# Patient Record
Sex: Male | Born: 1965 | Race: Black or African American | Hispanic: No | Marital: Single | Smoking: Current every day smoker
Health system: Southern US, Community
[De-identification: ages and names within clinical notes are randomized; demographics above are authoritative.]

## PROBLEM LIST (undated history)

## (undated) DIAGNOSIS — I10 Essential (primary) hypertension: Secondary | ICD-10-CM

## (undated) DIAGNOSIS — M1990 Unspecified osteoarthritis, unspecified site: Secondary | ICD-10-CM

## (undated) DIAGNOSIS — E785 Hyperlipidemia, unspecified: Secondary | ICD-10-CM

## (undated) DIAGNOSIS — E119 Type 2 diabetes mellitus without complications: Secondary | ICD-10-CM

## (undated) DIAGNOSIS — R7989 Other specified abnormal findings of blood chemistry: Secondary | ICD-10-CM

## (undated) HISTORY — DX: Type 2 diabetes mellitus without complications: E11.9

## (undated) HISTORY — DX: Essential (primary) hypertension: I10

## (undated) HISTORY — DX: Unspecified osteoarthritis, unspecified site: M19.90

## (undated) HISTORY — PX: HERNIA REPAIR: SHX51

## (undated) HISTORY — DX: Other specified abnormal findings of blood chemistry: R79.89

## (undated) HISTORY — DX: Hyperlipidemia, unspecified: E78.5

## (undated) HISTORY — PX: FINGER SURGERY: SHX640

---

## 1985-03-24 HISTORY — PX: WISDOM TOOTH EXTRACTION: SHX21

## 1988-03-24 HISTORY — PX: INGUINAL HERNIA REPAIR: SUR1180

## 1989-03-24 HISTORY — PX: FINGER SURGERY: SHX640

## 1998-03-24 HISTORY — PX: COLONOSCOPY: SHX174

## 2000-12-12 ENCOUNTER — Emergency Department (HOSPITAL_COMMUNITY): Admission: EM | Admit: 2000-12-12 | Discharge: 2000-12-12 | Payer: Self-pay | Admitting: Emergency Medicine

## 2000-12-12 ENCOUNTER — Encounter: Payer: Self-pay | Admitting: *Deleted

## 2003-06-12 ENCOUNTER — Emergency Department (HOSPITAL_COMMUNITY): Admission: AD | Admit: 2003-06-12 | Discharge: 2003-06-12 | Payer: Self-pay | Admitting: Family Medicine

## 2003-06-16 ENCOUNTER — Emergency Department (HOSPITAL_COMMUNITY): Admission: AD | Admit: 2003-06-16 | Discharge: 2003-06-16 | Payer: Self-pay | Admitting: Family Medicine

## 2008-03-21 ENCOUNTER — Emergency Department (HOSPITAL_COMMUNITY): Admission: EM | Admit: 2008-03-21 | Discharge: 2008-03-21 | Payer: Self-pay | Admitting: Family Medicine

## 2010-12-27 LAB — GC/CHLAMYDIA PROBE AMP, GENITAL
Chlamydia, DNA Probe: NEGATIVE
GC Probe Amp, Genital: NEGATIVE

## 2013-03-24 ENCOUNTER — Other Ambulatory Visit: Payer: Self-pay | Admitting: Family Medicine

## 2013-11-11 ENCOUNTER — Ambulatory Visit (INDEPENDENT_AMBULATORY_CARE_PROVIDER_SITE_OTHER): Payer: Self-pay | Admitting: Family Medicine

## 2013-11-11 VITALS — BP 118/72 | HR 85 | Temp 97.9°F | Resp 18 | Ht 65.5 in | Wt 192.0 lb

## 2013-11-11 DIAGNOSIS — E1165 Type 2 diabetes mellitus with hyperglycemia: Secondary | ICD-10-CM

## 2013-11-11 DIAGNOSIS — Z0289 Encounter for other administrative examinations: Secondary | ICD-10-CM

## 2013-11-11 DIAGNOSIS — IMO0001 Reserved for inherently not codable concepts without codable children: Secondary | ICD-10-CM

## 2013-11-11 NOTE — Progress Notes (Signed)
Physical exam:  History: This is a 48 year old man who comes in for a DOT physical exam. Apparently his car and was taken from him when he was laid off for a month because of severely uncontrolled diabetes. His blood glucose was high and the hemoglobin A1c was above 12. He has worked hard with a lot of regular exercise and medications as been changed. He is tolerating his new medications well including Xigduo XR 07/998 twice a day, glipizide 10 twice a day, and pioglitazone 30 mg daily. His A1c today at his doctor's office was 9.3, and glucose down to 133. He's been running 120-150 at home fasting. He feels much better. He has not any syncopal episodes.   Medical history: Operations: Inguinal herniorrhaphy Medical illnesses: Type 2 diabetes Medications: As above Allergies: None  Social history: Lives in [place]. 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  Review of systems: Constitutional: Energy level better Cardiovascular: Unremarkable Respiratory: Unremarkable HEENT: Unremarkable GI: Unremarkable GU: Unremarkable The skeletal: Unremarkable Dermatologic: Unremarkable Neurologic: Unremarkable   Physical exam: Healthy-appearing man in no acute distress. TMs normal. Eyes PERRLA. Fundi red reflex well seen, did not get a good retinal exam. Throat was clear. Neck supple without nodes thyromegaly. No carotid bruits. Chest clear. Heart regular without murmurs. Abdomen soft without mass or tenderness. Normal external genitalia with testes descended. Mild weakness of the right inguinal ring. Extremities unremarkable. Skin unremarkable. Skin spine normal.  Assessment: DOT physical examination Type 2 diabetes mellitus, poorly controlled, much improved  Plan: 6 months card. After the turn of the year he needs to come back in here and proved that he is maintaining good control still. Cautioned him that when he gets back on the road he will not be getting as much exercise and needs to be  asked her careful about his eating so that his sugars do not go back up.

## 2017-05-14 ENCOUNTER — Other Ambulatory Visit: Payer: Self-pay | Admitting: Specialist

## 2017-05-14 DIAGNOSIS — N63 Unspecified lump in unspecified breast: Secondary | ICD-10-CM

## 2017-07-09 ENCOUNTER — Other Ambulatory Visit: Payer: Self-pay | Admitting: Specialist

## 2017-07-09 DIAGNOSIS — N63 Unspecified lump in unspecified breast: Secondary | ICD-10-CM

## 2017-08-18 ENCOUNTER — Encounter: Payer: Self-pay | Admitting: Gastroenterology

## 2017-11-10 ENCOUNTER — Encounter: Payer: Self-pay | Admitting: Gastroenterology

## 2017-11-17 ENCOUNTER — Ambulatory Visit: Payer: Self-pay | Admitting: Gastroenterology

## 2017-12-28 ENCOUNTER — Ambulatory Visit: Payer: Self-pay | Admitting: Gastroenterology

## 2018-02-16 ENCOUNTER — Ambulatory Visit: Payer: Self-pay | Admitting: Gastroenterology

## 2019-11-24 ENCOUNTER — Other Ambulatory Visit: Payer: Self-pay

## 2019-11-24 ENCOUNTER — Ambulatory Visit: Payer: Managed Care, Other (non HMO) | Admitting: Student

## 2019-11-24 ENCOUNTER — Encounter: Payer: Self-pay | Admitting: Student

## 2019-11-24 VITALS — BP 123/79 | HR 107 | Temp 98.1°F | Wt 200.1 lb

## 2019-11-24 DIAGNOSIS — E119 Type 2 diabetes mellitus without complications: Secondary | ICD-10-CM

## 2019-11-24 DIAGNOSIS — Z1211 Encounter for screening for malignant neoplasm of colon: Secondary | ICD-10-CM

## 2019-11-24 DIAGNOSIS — A539 Syphilis, unspecified: Secondary | ICD-10-CM | POA: Insufficient documentation

## 2019-11-24 LAB — GLUCOSE, CAPILLARY: Glucose-Capillary: 223 mg/dL — ABNORMAL HIGH (ref 70–99)

## 2019-11-24 LAB — POCT GLYCOSYLATED HEMOGLOBIN (HGB A1C): Hemoglobin A1C: 7.2 % — AB (ref 4.0–5.6)

## 2019-11-24 MED ORDER — LISINOPRIL 2.5 MG PO TABS: 2.5000 mg | ORAL_TABLET | Freq: Every day | ORAL | 6 refills | Status: DC

## 2019-11-24 MED ORDER — XIGDUO XR 5-1000 MG PO TB24: 5.0000 mg | ORAL_TABLET | Freq: Every day | ORAL | 6 refills | Status: DC

## 2019-11-24 NOTE — Assessment & Plan Note (Signed)
Patient states that he was treated for possible syphilis in the past due to a painful lesion on right testicle.   Plan: -Check RPR, HIV, and hepatitis C panel

## 2019-11-24 NOTE — Patient Instructions (Addendum)
Mr. Baumgardner,  It was a pleasure getting to know you today.  Here is a summary what we talked about  1.  Type 2 diabetes: Your A1c today is 7.2.  You are doing a great job at diabetic management.  Continue taking the current medication.  Continue to eat healthy diet as much as possible and exercise as tolerated.  I ordered some blood test today and I will call you when I have the results.  2.  Unknown syphilis: I order a screening test for syphilis and I will call you when I have the results.  Please contact us if you have any questions or concerns,  TakeCare,  Gaylan Gerold, DO

## 2019-11-24 NOTE — Progress Notes (Addendum)
New Patient Office Visit  Subjective:  Patient ID: Aaron Guzman, male    DOB: 1965/08/03  Age: 54 y.o. MRN: [id number]  CC:  Chief Complaint  Patient presents with   Diabetes   Groin Pain    HPI Aaron Guzman is a 54 year old male with past medical history of type 2 diabetes who is here for establish care.  Patient was seen by PCP on [address] since 2014.  His last visit was around September 2020 and he states that they did blood work at that visit.  He said his last A1c was 7.8.  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  Patient reportedly taking dapagliflozin/Metformin and lisinopril.  Please see assessment and plan tab for further detail.  Past Medical History:  Diagnosis Date   Diabetes mellitus without complication ([place])    Hyperlipidemia    Hypertension    Low testosterone     Past Surgical History:  Procedure Laterality Date   FINGER SURGERY     HERNIA REPAIR      Family History  Problem Relation Age of Onset   Diabetes Mother    Cancer Mother    Diabetes Father    Cancer Father     Social History   Socioeconomic History   Marital status: Single    Spouse name: Not on file   Number of children: Not on file   Years of education: Not on file   Highest education level: Not on file  Occupational History   Not on file  Tobacco Use   Smoking status: Current Every Day Smoker    Types: Cigars   Tobacco comment: 1 pack a month  Substance and Sexual Activity   Alcohol use: Yes    Comment: Occasional   Drug use: No   Sexual activity: Not on file  Other Topics Concern   Not on file  Social History Narrative   Not on file   Social Determinants of Health   Financial Resource Strain:    Difficulty of Paying Living Expenses: Not on file  Food Insecurity:    Worried About Running Out of Food in the Last Year: Not on file   Ran Out of Food in the Last Year: Not on file  Transportation  Needs:    Lack of Transportation (Medical): Not on file   Lack of Transportation (Non-Medical): Not on file  Physical Activity:    Days of Exercise per Week: Not on file   Minutes of Exercise per Session: Not on file  Stress:    Feeling of Stress : Not on file  Social Connections:    Frequency of Communication with Friends and Family: Not on file   Frequency of Social Gatherings with Friends and Family: Not on file   Attends Religious Services: Not on file   Active Member of Clubs or Organizations: Not on file   Attends Archivist Meetings: Not on file   Marital Status: Not on file  Intimate Partner Violence:    Fear of Current or Ex-Partner: Not on file   Emotionally Abused: Not on file   Physically Abused: Not on file   Sexually Abused: Not on file    ROS   As per HPI   Objective:   Today's Vitals: BP 123/79 (BP Location: Right Arm, Patient Position: Sitting, Cuff Size: Small)    Pulse (!) 107    Temp 98.1 F (36.7 C) (Oral)    Wt 200 lb  1.6 oz (90.8 kg)    SpO2 97%    BMI 32.79 kg/m   Physical Exam Constitutional:      General: He is not in acute distress. HENT:     Head: Normocephalic.  Eyes:     General: No scleral icterus. Cardiovascular:     Rate and Rhythm: Normal rate and regular rhythm.     Heart sounds: No murmur heard.   Pulmonary:     Effort: No respiratory distress.     Breath sounds: Normal breath sounds.  Abdominal:     General: Bowel sounds are normal.     Palpations: Abdomen is soft.  Musculoskeletal:     Cervical back: Normal range of motion.     Right lower leg: No edema.     Left lower leg: No edema.     Comments: No wound noted +2 pulses LE bilat  Skin:    General: Skin is warm.     Coloration: Skin is not jaundiced.  Neurological:     Mental Status: He is alert.  Psychiatric:        Mood and Affect: Mood normal.      Assessment & Plan:   Problem List Items Addressed This Visit      Endocrine   Type  2 diabetes mellitus ([place]) - Primary    Today A1c 7.2 and random blood sugar was 223.  Patient is currently taking dapagliflozin/Metformin and has good diabetic control.  He is also taking lisinopril after found to have protein in the urine.  Patient states that he needs an eye exam referral.  He does check his feet at home and denies wound, ulcer, or numbness/tingling sensation.  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  He only checks CBG when he feels unwell.  Plan: -Hemoglobin A1c in 3 months -Continue dapagliflozin/Metformin 07/998 -Continue taking lisinopril 2.5 mg -Encourage healthier diet -Encourage exercise as able -Check BMP, UA with microalbumin and lipid panel today      Relevant Orders   POC Hbg A1C (Completed)   Lipid Profile   BMP8+Anion Gap   Microalbumin / Creatinine Urine Ratio     Other   Syphilis    Patient states that he was treated for possible syphilis in the past due to a painful lesion on right testicle.   Plan: -Check RPR, HIV, and hepatitis C panel      Relevant Orders   HIV antibody (with reflex)   Hepatitis C antibody   RPR      Outpatient Encounter Medications as of 11/24/2019  Medication Sig   Dapagliflozin-Metformin HCl ER (XIGDUO XR) 07-998 MG TB24 Take by mouth.   glipiZIDE (GLUCOTROL) 10 MG tablet Take 10 mg by mouth 2 (two) times daily before a meal.   pioglitazone (ACTOS) 30 MG tablet Take 30 mg by mouth daily.   No facility-administered encounter medications on file as of 11/24/2019.    Follow-up: Return in about 3 months (around 02/23/2020).   Gaylan Gerold, DO   Assessment & Plan:   See Encounters Tab for problem based charting.  Patient seen with Dr. Evette Doffing

## 2019-11-24 NOTE — Assessment & Plan Note (Addendum)
Today A1c 7.2 and random blood sugar was 223.  Patient is currently taking dapagliflozin/Metformin and has good diabetic control.  He is also taking lisinopril after found to have protein in the urine.  Patient states that he needs an eye exam referral.  He does check his feet at home and denies wound, ulcer, or numbness/tingling sensation.  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  He only checks CBG when he feels unwell.  Plan: -Hemoglobin A1c in 3 months -Continue dapagliflozin/Metformin 07/998 -Continue taking lisinopril 2.5 mg -Encourage healthier diet -Encourage exercise as able -Check BMP, UA with microalbumin and lipid panel today

## 2019-11-25 ENCOUNTER — Other Ambulatory Visit: Payer: Self-pay | Admitting: Student

## 2019-11-25 DIAGNOSIS — E782 Mixed hyperlipidemia: Secondary | ICD-10-CM

## 2019-11-25 LAB — BMP8+ANION GAP
Anion Gap: 19 mmol/L — ABNORMAL HIGH (ref 10.0–18.0)
BUN/Creatinine Ratio: 11 (ref 9–20)
BUN: 12 mg/dL (ref 6–24)
CO2: 17 mmol/L — ABNORMAL LOW (ref 20–29)
Calcium: 9.7 mg/dL (ref 8.7–10.2)
Chloride: 104 mmol/L (ref 96–106)
Creatinine, Ser: 1.08 mg/dL (ref 0.76–1.27)
GFR calc Af Amer: 89 mL/min/{1.73_m2} (ref >59)
GFR calc non Af Amer: 77 mL/min/{1.73_m2} (ref >59)
Glucose: 167 mg/dL — ABNORMAL HIGH (ref 65–99)
Potassium: 4.2 mmol/L (ref 3.5–5.2)
Sodium: 140 mmol/L (ref 134–144)

## 2019-11-25 LAB — LIPID PANEL
Chol/HDL Ratio: 4.5 ratio (ref 0.0–5.0)
Cholesterol, Total: 191 mg/dL (ref 100–199)
HDL: 42 mg/dL (ref >39)
LDL Chol Calc (NIH): 106 mg/dL — ABNORMAL HIGH (ref 0–99)
Triglycerides: 248 mg/dL — ABNORMAL HIGH (ref 0–149)
VLDL Cholesterol Cal: 43 mg/dL — ABNORMAL HIGH (ref 5–40)

## 2019-11-25 LAB — RPR: RPR Ser Ql: NONREACTIVE

## 2019-11-25 LAB — HEPATITIS C ANTIBODY: Hep C Virus Ab: <0.1 s/co ratio (ref 0.0–0.9)

## 2019-11-25 LAB — MICROALBUMIN / CREATININE URINE RATIO
Creatinine, Urine: 55.4 mg/dL
Microalb/Creat Ratio: <5 mg/g creat (ref 0–29)
Microalbumin, Urine: <3 ug/mL

## 2019-11-25 LAB — HIV ANTIBODY (ROUTINE TESTING W REFLEX): HIV Screen 4th Generation wRfx: NONREACTIVE

## 2019-11-25 MED ORDER — ATORVASTATIN CALCIUM 80 MG PO TABS: 80.0000 mg | ORAL_TABLET | Freq: Every day | ORAL | 11 refills | Status: DC

## 2019-11-25 NOTE — Progress Notes (Signed)
Internal Medicine Clinic Attending  I saw and evaluated the patient.  I personally confirmed the key portions of the history and exam documented by Dr. Alfonse Spruce and I reviewed pertinent patient test results.  The assessment, diagnosis, and plan were formulated together and I agree with the documentation in the resident's note.   54 year old person living with diabetes. Glucose control is reasonable, he is feeling well with good functional status. Labs show an incidental anion gap metabolic acidosis. I am not sure the cause, or if it is accurate, as he shows no signs or symptoms of acidosis. No signs of DKA and doubt lactic acidosis given his appearance. Will plan to repeat labs at next visit, if the gap persists, would stop metformin and do more work up for the cause.

## 2019-12-09 ENCOUNTER — Encounter: Payer: Self-pay | Admitting: Gastroenterology

## 2019-12-23 ENCOUNTER — Telehealth: Payer: Self-pay | Admitting: Dietician

## 2019-12-23 NOTE — Telephone Encounter (Signed)
Mr. Aaron Guzman called asking about a Continuous glucose monitor to help he and his doctor see how his blood sugars are doing. He does not like sticking his finger and is interested in trying a sample. He agreed to call when he is back in town.

## 2020-02-22 ENCOUNTER — Encounter: Payer: Managed Care, Other (non HMO) | Admitting: Gastroenterology

## 2020-02-23 ENCOUNTER — Ambulatory Visit (INDEPENDENT_AMBULATORY_CARE_PROVIDER_SITE_OTHER): Payer: No Typology Code available for payment source | Admitting: Student

## 2020-02-23 ENCOUNTER — Other Ambulatory Visit: Payer: Self-pay

## 2020-02-23 ENCOUNTER — Encounter: Payer: Self-pay | Admitting: Student

## 2020-02-23 VITALS — BP 125/71 | HR 99 | Temp 98.2°F | Ht 66.0 in | Wt 207.1 lb

## 2020-02-23 DIAGNOSIS — E785 Hyperlipidemia, unspecified: Secondary | ICD-10-CM | POA: Insufficient documentation

## 2020-02-23 DIAGNOSIS — M19012 Primary osteoarthritis, left shoulder: Secondary | ICD-10-CM | POA: Diagnosis not present

## 2020-02-23 DIAGNOSIS — M151 Heberden's nodes (with arthropathy): Secondary | ICD-10-CM | POA: Diagnosis not present

## 2020-02-23 DIAGNOSIS — E119 Type 2 diabetes mellitus without complications: Secondary | ICD-10-CM

## 2020-02-23 DIAGNOSIS — G8929 Other chronic pain: Secondary | ICD-10-CM

## 2020-02-23 DIAGNOSIS — M25512 Pain in left shoulder: Secondary | ICD-10-CM | POA: Diagnosis not present

## 2020-02-23 DIAGNOSIS — E782 Mixed hyperlipidemia: Secondary | ICD-10-CM

## 2020-02-23 LAB — POCT GLYCOSYLATED HEMOGLOBIN (HGB A1C): Hemoglobin A1C: 8 % — AB (ref 4.0–5.6)

## 2020-02-23 LAB — GLUCOSE, CAPILLARY: Glucose-Capillary: 179 mg/dL — ABNORMAL HIGH (ref 70–99)

## 2020-02-23 MED ORDER — XIGDUO XR 5-1000 MG PO TB24: 2.0000 | ORAL_TABLET | Freq: Every day | ORAL | 2 refills | Status: DC

## 2020-02-23 NOTE — Assessment & Plan Note (Signed)
Patient complains of left shoulder pain that has been going on for about 3 months.  Pain locates in the left upper shoulder and shoulder joint.  Described pain as throbbing and aching, worse with motion and better with rest.  States that sometimes his left arm feel weak.  Denies trauma to the area or fever.  Patient had been lifting weights but had to stop due to pain.  Also contributed this pain to sleeping on his right side in the truck.  He has not taking any medication to help with the pain.  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  Advised patient to take NSAIDs for pain relief.  Also can try Voltaren gel.  Patient is advised to let the shoulder rest for a few weeks before starting back on weightlifting.  If conservative treatments are ineffective, can consider steroid joint injection.

## 2020-02-23 NOTE — Patient Instructions (Addendum)
Aaron Guzman,  It is a pleasure seeing you today.  Here is a summary of what we talked about:  1.  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  You can take NSAIDs such as Motrin for pain relief.  You can also obtain Voltaren gel over-the-counter and apply it on your shoulder.  Please rest your shoulder for few weeks.  2.  Right finger joint pain: I will order x-ray of your right hand to check for any bone abnormality.  I also obtain some blood work to rule out other causes of his joint pain  3.  Diabetes: I will check hemoglobin A1c today.  I also refer you to our diabetic coordinator who can help you with ordering the freestyle libre continuous glucose monitor.  Take care  Dr. Alfonse Spruce

## 2020-02-23 NOTE — Progress Notes (Signed)
   CC: Left shoulder pain and right DIP joints pain  HPI:  [name] is a 54 y.o. of diabetes, hypertension, hyperlipidemia who presents to the clinic for chief complaint of left shoulder pain and right DIP joints pain  Please see problem based charting for further detail  Past Medical History:  Diagnosis Date  . Diabetes mellitus without complication ([place])   . Hyperlipidemia   . Hypertension   . Low testosterone    Review of Systems: As per HPI  Physical Exam:  Vitals:   02/23/20 1330  BP: 125/71  Pulse: 99  Temp: 98.2 F (36.8 C)  TempSrc: Oral  SpO2: 96%  Weight: 207 lb 1.6 oz (93.9 kg)  Height: 5\' 6"  (1.676 m)   Physical Exam Constitutional:      General: He is not in acute distress. Eyes:     General:        Right eye: No discharge.        Left eye: No discharge.  Cardiovascular:     Rate and Rhythm: Normal rate and regular rhythm.     Heart sounds: Normal heart sounds.  Pulmonary:     Effort: No respiratory distress.     Breath sounds: Normal breath sounds.  Musculoskeletal:     Comments: Normal range of motion of left shoulder, pain of the acromioclavicular joint with palpation.  Normal strength and sensation.  Normal range of motion of bilateral DIP joints.  Some hardened, bony structure palpated, especially in the right middle finger DIP joint  Neurological:     Mental Status: He is alert.  Psychiatric:        Mood and Affect: Mood normal.      Assessment & Plan:   See Encounters Tab for problem based charting.  Patient seen with Dr. Rebeca Alert

## 2020-02-23 NOTE — Assessment & Plan Note (Signed)
He complains of pain bilateral DIP joints that has been going for years.  Pain is worst of the right middle finger.  States that the DIP joint is swollen and painful to touch when it occurs.  Patient states that he has been jamming his fingers onto hard surface multiple times and it/abates his pain.  Physical exam shows normal range of motion of the DIP joints.  However cannot palpate some hardened structure beneath the joint which suspicion for tophi vs bony injuries.  Plan: -Obtain x-ray of his right hand to rule out any bony abnormality. -Cannot rule out inflammatory joint disease so obtain ESR and CRP -Also obtain uric acid for gout suspicion

## 2020-02-23 NOTE — Assessment & Plan Note (Signed)
Last A1c was 7.2 in September and today 8.0.  Patient states that he has been taking his dapagliflozin-Metformin 07-998 mg once a day instead of 2 tablets daily as he was prescribed by his last PCP.  Advised patient to start taking this medication twice daily.  New prescription sent in.  Patient also requests continuous glucose monitor advised.  Plan: -Dapagliflozin-Metformin 07-998 mg 2 tablets daily -A1c in 3 months -Also referral to [facility] for freestyle Ver[place] request

## 2020-02-24 LAB — URIC ACID: Uric Acid: 5.5 mg/dL (ref 3.8–8.4)

## 2020-02-24 LAB — C-REACTIVE PROTEIN: CRP: 3 mg/L (ref 0–10)

## 2020-02-24 LAB — SEDIMENTATION RATE: Sed Rate: 8 mm/hr (ref 0–30)

## 2020-02-24 LAB — HM DIABETES EYE EXAM

## 2020-02-27 ENCOUNTER — Other Ambulatory Visit: Payer: Self-pay | Admitting: Student

## 2020-02-27 DIAGNOSIS — E119 Type 2 diabetes mellitus without complications: Secondary | ICD-10-CM

## 2020-02-27 NOTE — Progress Notes (Signed)
Last BMP in September show metabolic acidosis.  Will repeat BMP when patient comes back to [facility] in January.

## 2020-03-01 ENCOUNTER — Encounter: Payer: Self-pay | Admitting: *Deleted

## 2020-03-06 NOTE — Progress Notes (Signed)
Internal Medicine Clinic Attending  I saw and evaluated the patient.  I personally confirmed the key portions of the history and exam documented by Dr. Alfonse Spruce and I reviewed pertinent patient test results.  The assessment, diagnosis, and plan were formulated together and I agree with the documentation in the resident's note.  Lenice Pressman, M.D., Ph.D.

## 2020-03-27 NOTE — Addendum Note (Signed)
Addended by: Neomia Dear on: 03/27/2020 05:43 PM   Modules accepted: Orders

## 2020-04-16 ENCOUNTER — Encounter: Payer: No Typology Code available for payment source | Admitting: Dietician

## 2020-04-16 ENCOUNTER — Other Ambulatory Visit: Payer: Self-pay | Admitting: Dietician

## 2020-04-16 DIAGNOSIS — E119 Type 2 diabetes mellitus without complications: Secondary | ICD-10-CM

## 2020-04-16 MED ORDER — FREESTYLE LIBRE 2 SENSOR MISC: 1 refills | Status: DC

## 2020-04-16 NOTE — Telephone Encounter (Addendum)
Aaron Guzman rescheduled his appointment for a trial CGM. I asked him to try to download the [facility] 2 app and to call if he cannot. Sent him and his pharmacy the  voucher for a free sample sensor below. Request prescription for a Freestyle Libre 2 sensor be sent to [place]. BIN: [id number] AYOKH:[id number] MEMBER: [id number] Expiration:06/15/20 [id number]

## 2020-05-04 ENCOUNTER — Ambulatory Visit (INDEPENDENT_AMBULATORY_CARE_PROVIDER_SITE_OTHER): Payer: No Typology Code available for payment source | Admitting: Dietician

## 2020-05-04 ENCOUNTER — Encounter: Payer: Self-pay | Admitting: Dietician

## 2020-05-04 DIAGNOSIS — Z713 Dietary counseling and surveillance: Secondary | ICD-10-CM | POA: Diagnosis not present

## 2020-05-04 DIAGNOSIS — E119 Type 2 diabetes mellitus without complications: Secondary | ICD-10-CM | POA: Diagnosis not present

## 2020-05-04 NOTE — Patient Instructions (Addendum)
Thank you for your visit today!  You started your first Freestyle [place] 2 sensor today.   You can use skin tac or an overpatch to help the sensor stick if needed.   We suggest scanning your sensor at least 6 times a day  1- Before meals 2- 1-2 hours After meals 3- when you do not feel right or feel funny 4- when you wake up 5-  Before bed  [facility] Penny [phone]

## 2020-05-04 NOTE — Progress Notes (Signed)
Diabetes Self-Management Education  Visit Type: First/Initial  Appt. Start Time: 1100 Appt. End Time: 1130  05/04/2020  Mr. Aaron Guzman, identified by name and date of birth, is a 55 y.o. male with a diagnosis of Diabetes: Type 2.   ASSESSMENT Freestyle Libre Personal CGM Training Total time: [place] was educated about the following:  -Getting to know device    Marland Kitchen/phone programmed ) -Setting up device , trend arrows - sensor interaction with vitamin C: do not take more than 500 mg vitamin C daily -Setting alert profile (high alert  240 , low alert 70)  setting up reminders (reminders for 6x/day testing  ) -Inserting sensor (patient applied the sensor on his right upper back of arm  himself today with minimal assist. It appeared to be working and in warm up when he left the office) -Calibrating- none required. - using meter when test blood sugar symbol appears -Ending sensor session -Trouble shooting -Tape guide, ability to upload from home with email address (he accepted invitation today)   Patient has [facility] tech support and my contact information. Follow up was arranged in 4 weeks    Diabetes Self-Management Education - 05/04/20 1200      Visit Information   Visit Type First/Initial      Initial Visit   Diabetes Type Type 2    Are you currently following a meal plan? No    Are you taking your medications as prescribed? Yes      Health Coping   How would you rate your overall health? Good      Psychosocial Assessment   Patient Belief/Attitude about Diabetes Motivated to manage diabetes    Self-care barriers Other (comment)   he is a long distance truck driver   Self-management support Family;Doctor's office;CDE visits    Other persons present Other (comment)   Ailene Ravel, RDN   Patient Concerns Monitoring    Special Needs None    Preferred Learning Style Hands on    Learning Readiness Ready    How often do you need to have someone help  you when you read instructions, pamphlets, or other written materials from your doctor or pharmacy? 2 - Rarely      Pre-Education Assessment   Patient understands the diabetes disease and treatment process. Demonstrates understanding / competency    Patient understands incorporating nutritional management into lifestyle. Needs Instruction    Patient undertands incorporating physical activity into lifestyle. Demonstrates understanding / competency    Patient understands using medications safely. Demonstrates understanding / competency    Patient understands monitoring blood glucose, interpreting and using results Needs Instruction    Patient understands prevention, detection, and treatment of acute complications. Needs Instruction    Patient understands prevention, detection, and treatment of chronic complications. Needs Instruction    Patient understands how to develop strategies to address psychosocial issues. Demonstrates understanding / competency    Patient understands how to develop strategies to promote health/change behavior. Demonstrates understanding / competency      Complications   Last HgB A1C per patient/outside source 8 %    How often do you check your blood sugar? 0 times/day (not testing)    Have you had a dilated eye exam in the past 12 months? Yes    Have you had a dental exam in the past 12 months? Yes      Dietary Intake   Beverage(s) ginger ale, mt dew and orange soda      Exercise  Exercise Type ADL's;Light (walking / raking leaves)      Patient Education   Previous Diabetes Education No    Nutrition management  Role of diet in the treatment of diabetes and the relationship between the three main macronutrients and blood glucose level;Reviewed blood glucose goals for pre and post meals and how to evaluate the patients' food intake on their blood glucose level.    Monitoring Other (comment)   educated about personal CGM   Acute complications Discussed and identified  patients' treatment of hyperglycemia.      Individualized Goals (developed by patient)   Monitoring  test my blood glucose as discussed      Outcomes   Expected Outcomes Demonstrated interest in learning. Expect positive outcomes    Future DMSE 4-6 wks    Program Status Not Completed           Individualized Plan for Diabetes Self-Management Training:   Learning Objective:  Patient will have a greater understanding of diabetes self-management. Patient education plan is to attend individual and/or group sessions per assessed needs and concerns.   Plan:   Patient Instructions  Thank you for your visit today!  You started your first Freestyle [place] 2 sensor today.   You can use skin tac or an overpatch to help the sensor stick if needed.   We suggest scanning your sensor at least 6 times a day  1- Before meals 2- 1-2 hours After meals 3- when you do not feel right or feel funny 4- when you wake up 5-  Before bed  [facility] Penny [phone]     Expected Outcomes:  Demonstrated interest in learning. Expect positive outcomes  Education material provided: Diabetes Resources  If problems or questions, patient to contact team via:  Phone  Future DSME appointment: 4-6 wks  Debera Lat, RD 05/04/2020 12:15 PM.

## 2020-05-05 LAB — BMP8+ANION GAP
Anion Gap: 20 mmol/L — ABNORMAL HIGH (ref 10.0–18.0)
BUN/Creatinine Ratio: 15 (ref 9–20)
BUN: 14 mg/dL (ref 6–24)
CO2: 15 mmol/L — ABNORMAL LOW (ref 20–29)
Calcium: 9.6 mg/dL (ref 8.7–10.2)
Chloride: 104 mmol/L (ref 96–106)
Creatinine, Ser: 0.96 mg/dL (ref 0.76–1.27)
GFR calc Af Amer: 102 mL/min/{1.73_m2} (ref >59)
GFR calc non Af Amer: 89 mL/min/{1.73_m2} (ref >59)
Glucose: 256 mg/dL — ABNORMAL HIGH (ref 65–99)
Potassium: 4 mmol/L (ref 3.5–5.2)
Sodium: 139 mmol/L (ref 134–144)

## 2020-05-16 ENCOUNTER — Other Ambulatory Visit: Payer: Self-pay | Admitting: Internal Medicine

## 2020-05-16 DIAGNOSIS — E119 Type 2 diabetes mellitus without complications: Secondary | ICD-10-CM

## 2020-05-17 ENCOUNTER — Other Ambulatory Visit: Payer: Self-pay

## 2020-05-17 DIAGNOSIS — E119 Type 2 diabetes mellitus without complications: Secondary | ICD-10-CM

## 2020-05-17 MED ORDER — XIGDUO XR 5-1000 MG PO TB24: 2.0000 | ORAL_TABLET | Freq: Every day | ORAL | 3 refills | Status: DC

## 2020-05-17 NOTE — Telephone Encounter (Signed)
He is requesting a 90 day supply of his Diabetic medication sent to pharmacy for him to have for next fill.  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  He would also like a 3 month RX for hix Freestyle Libre 2 sensor, but states he can get this at his NOV which is 06/01/20 with Dr. Alfonse Spruce (added to appt notes) Thank you, SChaplin, RN,BSN

## 2020-05-30 ENCOUNTER — Other Ambulatory Visit: Payer: Self-pay | Admitting: Student

## 2020-05-30 DIAGNOSIS — E119 Type 2 diabetes mellitus without complications: Secondary | ICD-10-CM

## 2020-06-01 ENCOUNTER — Ambulatory Visit (INDEPENDENT_AMBULATORY_CARE_PROVIDER_SITE_OTHER): Payer: No Typology Code available for payment source | Admitting: Student

## 2020-06-01 ENCOUNTER — Ambulatory Visit (HOSPITAL_COMMUNITY)
Admission: RE | Admit: 2020-06-01 | Discharge: 2020-06-01 | Disposition: A | Discharge status: Home / Self Care | Payer: No Typology Code available for payment source | Source: Ambulatory Visit | Attending: Internal Medicine | Admitting: Internal Medicine

## 2020-06-01 ENCOUNTER — Other Ambulatory Visit: Payer: Self-pay

## 2020-06-01 ENCOUNTER — Encounter: Payer: Self-pay | Admitting: Student

## 2020-06-01 VITALS — BP 128/79 | HR 90 | Temp 98.0°F | Wt 203.4 lb

## 2020-06-01 DIAGNOSIS — M151 Heberden's nodes (with arthropathy): Secondary | ICD-10-CM | POA: Diagnosis not present

## 2020-06-01 DIAGNOSIS — E782 Mixed hyperlipidemia: Secondary | ICD-10-CM

## 2020-06-01 DIAGNOSIS — E119 Type 2 diabetes mellitus without complications: Secondary | ICD-10-CM

## 2020-06-01 DIAGNOSIS — I10 Essential (primary) hypertension: Secondary | ICD-10-CM | POA: Diagnosis not present

## 2020-06-01 LAB — POCT GLYCOSYLATED HEMOGLOBIN (HGB A1C): Hemoglobin A1C: 7.7 % — AB (ref 4.0–5.6)

## 2020-06-01 LAB — GLUCOSE, CAPILLARY: Glucose-Capillary: 228 mg/dL — ABNORMAL HIGH (ref 70–99)

## 2020-06-01 LAB — LACTIC ACID, PLASMA: Lactic Acid, Venous: 4.1 mmol/L (ref 0.5–1.9)

## 2020-06-01 MED ORDER — DAPAGLIFLOZIN PROPANEDIOL 10 MG PO TABS: 10.0000 mg | ORAL_TABLET | Freq: Every day | ORAL | 3 refills | Status: DC

## 2020-06-01 MED ORDER — ROSUVASTATIN CALCIUM 20 MG PO TABS: 20.0000 mg | ORAL_TABLET | Freq: Every day | ORAL | 3 refills | Status: DC

## 2020-06-01 MED ORDER — TRULICITY 0.75 MG/0.5ML ~~LOC~~ SOAJ: 0.7500 mg | SUBCUTANEOUS | 3 refills | Status: DC

## 2020-06-01 NOTE — Assessment & Plan Note (Signed)
Initial blood pressure 138/79.  Repeat 128/79.  -Continue lisinopril 2.5 mg

## 2020-06-01 NOTE — Assessment & Plan Note (Signed)
Patient was on Lipitor 80 mg but stopped 1 month ago due to side effects of heartburn.  Patient states that his symptoms ceased after stopping Lipitor.  I offered to change to Crestor 20 mg.  Patient agrees.  -Stop Lipitor 80 mg -Start Crestor 20 mg

## 2020-06-01 NOTE — Progress Notes (Signed)
   CC: Diabetes follow-up  HPI:  Mr.Rodriquez Nicodemus is a 55 y.o. with past medical history of hypertension, diabetes, hyperlipidemia, who presented to the clinic for 3 months follow-up.  Please see problem space charting for further detail  Past Medical History:  Diagnosis Date  . Diabetes mellitus without complication ([place])   . Hyperlipidemia   . Hypertension   . Low testosterone    Review of Systems:   Review of Systems  Constitutional: Negative.   Gastrointestinal: Negative for constipation, diarrhea, nausea and vomiting.  Musculoskeletal: Positive for joint pain.    Physical Exam:  Vitals:   06/01/20 1026  BP: 138/79  Pulse: (!) 103  Temp: 98 F (36.7 C)  TempSrc: Oral  SpO2: 93%  Weight: 203 lb 6.4 oz (92.3 kg)   Physical Exam Constitutional:      General: He is not in acute distress. HENT:     Head: Normocephalic.  Eyes:     General:        Right eye: No discharge.        Left eye: No discharge.  Cardiovascular:     Rate and Rhythm: Normal rate and regular rhythm.  Pulmonary:     Effort: Pulmonary effort is normal. No respiratory distress.  Abdominal:     General: Bowel sounds are normal.  Musculoskeletal:     Cervical back: Normal range of motion.     Comments: Pain to palpation of DIP of right little and middle finger.  No joint effusion or erythema noted.  Protuberance of DIP joint palpated of right little middle finger.  Radial pulse palpated.  Normal range of motion of other fingers.  Skin:    General: Skin is warm.  Neurological:     General: No focal deficit present.     Mental Status: He is alert.  Psychiatric:        Mood and Affect: Mood normal.     Assessment & Plan:   See Encounters Tab for problem based charting.  Patient discussed with Dr. Dareen Piano

## 2020-06-01 NOTE — Patient Instructions (Addendum)
Mr. Aaron Guzman,  It is a pleasure seeing you in the clinic today.  Here is a summary of what we talked about:  1.  Diabetes: Your hemoglobin A1c is 7.7 today.  Goal hemoglobin A1C less than 7.  I will add a once weekly injection called Trulicity.  Please pick it up at your pharmacy.  I will call you for the results of your blood work and decide if we will continue Metformin.  2.  Cholesterol: I will change Lipitor to Crestor 20 mg daily.  Sent the prescription to your pharmacy.  3.  Hand pain: Please get the x-ray of stairs.  I will call you with the result.  Please use ibuprofen or Tylenol for pain and ice for swelling.  4.  High blood pressure: Your blood pressure looks good today.  Continue lisinopril 2.5 mg  Take care  Dr. Alfonse Spruce

## 2020-06-01 NOTE — Addendum Note (Signed)
Addended byGaylan Gerold on: 06/01/2020 12:08 PM   Modules accepted: Orders

## 2020-06-01 NOTE — Addendum Note (Signed)
Addended byGaylan Gerold on: 06/01/2020 12:28 PM   Modules accepted: Orders

## 2020-06-01 NOTE — Assessment & Plan Note (Signed)
Patient endorses pain of right little and middle finger DIP joints that started a few days ago but has improved.  Endorses some swelling of the joints as well.  He has had this pain for 2-1/2 years.  Denies any trauma to the area.  His CRP, ESR and uric acid checked last visit was within normal limits.  We also order x-ray of his right hand but was not done.  This is likely osteoarthritis due to his history.  His physical exam is benign.  Normal ESR, CRP and uric acid makes gout or rheumatoid arthritis less likely.  -Patient will obtain x-ray today -Advised patient to NSAIDs or Tylenol for pain.  Ice for inflammation and swelling

## 2020-06-01 NOTE — Assessment & Plan Note (Addendum)
A1c 3 months ago was 8, 7.7 today.  Patient currently taking dapagliflozin-Metformin 07-998 mg twice daily.  His last 2 BMP were consistent with anion gap metabolic acidosis.  Not sure if this related to lactic acid caused by Metformin.  Recheck BMP and lactic acid today, if normal will discontinue Metformin.  Also start Trulicity 1.31 mg weekly.  -Continue dapagliflozin-Metformin for now.  -Start Trulicity 4.38 mg weekly -A1c in 3 months -Pending BMP and lactic acid  Addendum Lactic acid came back 4.1.  This confirmed lactic acid caused by Metformin.  Will stop Metformin and continue dapagliflozin alone.  Repeat BMP in 1-2 weeks.  Patient was informed of the result and agree with the plan.

## 2020-06-02 LAB — BMP8+ANION GAP
Anion Gap: 22 mmol/L — ABNORMAL HIGH (ref 10.0–18.0)
BUN/Creatinine Ratio: 10 (ref 9–20)
BUN: 10 mg/dL (ref 6–24)
CO2: 16 mmol/L — ABNORMAL LOW (ref 20–29)
Calcium: 9.3 mg/dL (ref 8.7–10.2)
Chloride: 101 mmol/L (ref 96–106)
Creatinine, Ser: 0.98 mg/dL (ref 0.76–1.27)
Glucose: 239 mg/dL — ABNORMAL HIGH (ref 65–99)
Potassium: 3.8 mmol/L (ref 3.5–5.2)
Sodium: 139 mmol/L (ref 134–144)
eGFR: 91 mL/min/{1.73_m2} (ref >59)

## 2020-06-04 ENCOUNTER — Telehealth: Payer: Self-pay | Admitting: *Deleted

## 2020-06-04 NOTE — Telephone Encounter (Addendum)
Call to patient's insurance company [employer] at [phone].  Spoke to resebtative.  No PA needed for 30 day supply.  Spoke with Dr. Alfonse Spruce who is ok with 30 day supplies of Faxiga.  Called to [place] was run as 30 day supply.  Sander Nephew, RN 06/04/2920 3:47 PM.

## 2020-06-04 NOTE — Progress Notes (Signed)
Internal Medicine Clinic Attending  Case discussed with Dr. Nguyen  At the time of the visit.  We reviewed the resident's history and exam and pertinent patient test results.  I agree with the assessment, diagnosis, and plan of care documented in the resident's note. 

## 2020-06-18 ENCOUNTER — Other Ambulatory Visit: Payer: Self-pay | Admitting: Internal Medicine

## 2020-06-18 DIAGNOSIS — E119 Type 2 diabetes mellitus without complications: Secondary | ICD-10-CM

## 2020-06-27 ENCOUNTER — Other Ambulatory Visit (INDEPENDENT_AMBULATORY_CARE_PROVIDER_SITE_OTHER): Payer: No Typology Code available for payment source

## 2020-06-27 ENCOUNTER — Other Ambulatory Visit: Payer: Self-pay | Admitting: Dietician

## 2020-06-27 DIAGNOSIS — E119 Type 2 diabetes mellitus without complications: Secondary | ICD-10-CM | POA: Diagnosis not present

## 2020-06-27 LAB — BASIC METABOLIC PANEL
Anion gap: 8 (ref 5–15)
BUN: 9 mg/dL (ref 6–20)
CO2: 22 mmol/L (ref 22–32)
Calcium: 9.3 mg/dL (ref 8.9–10.3)
Chloride: 108 mmol/L (ref 98–111)
Creatinine, Ser: 1.12 mg/dL (ref 0.61–1.24)
GFR, Estimated: >60 mL/min (ref >60)
Glucose, Bld: 136 mg/dL — ABNORMAL HIGH (ref 70–99)
Potassium: 3.9 mmol/L (ref 3.5–5.1)
Sodium: 138 mmol/L (ref 135–145)

## 2020-06-27 LAB — LACTIC ACID, PLASMA: Lactic Acid, Venous: 1.4 mmol/L (ref 0.5–1.9)

## 2020-06-27 MED ORDER — FREESTYLE LIBRE 2 SENSOR MISC: 3 refills | Status: DC

## 2020-06-27 NOTE — Addendum Note (Signed)
Addended by: Resa Miner on: 06/27/2020 10:51 AM   Modules accepted: Orders

## 2020-06-27 NOTE — Telephone Encounter (Addendum)
Aaron Guzman asked for his data to be reveiwed on his Continuous glucose monitor. We connected him remotely to do this. He was encouraged to scan/wand at least one more time a day and find a way to decrease his soda consumption to help lower his % time high (180-250). The CGM report is in IMP yellow box.  He requests 3 month supply of freestyle [place] sensors

## 2020-07-08 ENCOUNTER — Other Ambulatory Visit: Payer: Self-pay | Admitting: Student

## 2020-07-08 DIAGNOSIS — E119 Type 2 diabetes mellitus without complications: Secondary | ICD-10-CM

## 2020-07-25 ENCOUNTER — Telehealth: Payer: Self-pay

## 2020-07-25 NOTE — Telephone Encounter (Signed)
error 

## 2020-09-16 ENCOUNTER — Other Ambulatory Visit: Payer: Self-pay | Admitting: Student

## 2020-09-16 DIAGNOSIS — E119 Type 2 diabetes mellitus without complications: Secondary | ICD-10-CM

## 2020-09-18 ENCOUNTER — Encounter: Payer: No Typology Code available for payment source | Admitting: Internal Medicine

## 2020-09-20 ENCOUNTER — Other Ambulatory Visit: Payer: Self-pay

## 2020-09-20 ENCOUNTER — Encounter: Payer: Self-pay | Admitting: Internal Medicine

## 2020-09-20 ENCOUNTER — Ambulatory Visit (INDEPENDENT_AMBULATORY_CARE_PROVIDER_SITE_OTHER): Payer: No Typology Code available for payment source | Admitting: Internal Medicine

## 2020-09-20 VITALS — BP 114/72 | HR 95 | Temp 97.8°F | Ht 66.0 in | Wt 204.7 lb

## 2020-09-20 DIAGNOSIS — E119 Type 2 diabetes mellitus without complications: Secondary | ICD-10-CM | POA: Diagnosis not present

## 2020-09-20 DIAGNOSIS — Z1211 Encounter for screening for malignant neoplasm of colon: Secondary | ICD-10-CM

## 2020-09-20 DIAGNOSIS — E782 Mixed hyperlipidemia: Secondary | ICD-10-CM

## 2020-09-20 DIAGNOSIS — Z0000 Encounter for general adult medical examination without abnormal findings: Secondary | ICD-10-CM

## 2020-09-20 LAB — POCT GLYCOSYLATED HEMOGLOBIN (HGB A1C): Hemoglobin A1C: 8.5 % — AB (ref 4.0–5.6)

## 2020-09-20 LAB — GLUCOSE, CAPILLARY: Glucose-Capillary: 221 mg/dL — ABNORMAL HIGH (ref 70–99)

## 2020-09-20 MED ORDER — TRULICITY 0.75 MG/0.5ML ~~LOC~~ SOAJ
1.5000 mg | SUBCUTANEOUS | 0 refills | Status: DC
Start: 2020-09-20 — End: 2020-09-20

## 2020-09-20 MED ORDER — LISINOPRIL 2.5 MG PO TABS
2.5000 mg | ORAL_TABLET | Freq: Every day | ORAL | 2 refills | Status: DC
Start: 2020-09-20 — End: 2021-06-19

## 2020-09-20 MED ORDER — TRULICITY 1.5 MG/0.5ML ~~LOC~~ SOAJ: 1.5000 mg | SUBCUTANEOUS | 2 refills | Status: DC

## 2020-09-20 MED ORDER — ROSUVASTATIN CALCIUM 20 MG PO TABS: 20.0000 mg | ORAL_TABLET | Freq: Every day | ORAL | 3 refills | Status: DC

## 2020-09-20 MED ORDER — DAPAGLIFLOZIN PROPANEDIOL 10 MG PO TABS: 10.0000 mg | ORAL_TABLET | Freq: Every day | ORAL | 3 refills | Status: DC

## 2020-09-20 MED ORDER — TRULICITY 0.75 MG/0.5ML ~~LOC~~ SOAJ: 1.5000 mg | SUBCUTANEOUS | 0 refills | Status: DC

## 2020-09-20 NOTE — Assessment & Plan Note (Signed)
Lipid panel last checked a year ago, will repeat today.  Currently on Crestor 20 mg tolerating this well.

## 2020-09-20 NOTE — Assessment & Plan Note (Addendum)
Hemoglobin A1c 8.5, 7.7 3 months ago.  Patient is currently on dapagliflozin 10 mg daily and Trulicity 6.81 mg weekly.  Metformin was discontinued due to lactic acidosis.  Average glucose from his freestyle glucose monitor shows a reading of 166 with no notable highs or lows.  Patient reports 1 episode of hypoglycemia with sugars in the 50s overnight.  States that he had only had a salad that night for dinner and woke up sweating.  No other episodes of hypoglycemia.  Plan: -Increase Trulicity to 1.5 mg weekly -Continue dapagliflozin 10 mg daily -Follow-up in 3 months

## 2020-09-20 NOTE — Assessment & Plan Note (Signed)
Patient denies vaccinations today.  Discussed the pneumonia and Shingrix vaccinations as well as the COVID vaccination.  Patient is not interested at this time.  He is due for colonoscopy, referral placed

## 2020-09-20 NOTE — Addendum Note (Signed)
Addended by: Mike Craze on: 09/20/2020 11:37 AM   Modules accepted: Orders

## 2020-09-20 NOTE — Patient Instructions (Addendum)
Please increase your Trulicity to 1.5 mg weekly.    I am also checking your cholesterol levels today I will call you if the results are abnormal.  Plan to follow-up in 3 months for your diabetes.

## 2020-09-20 NOTE — Progress Notes (Signed)
   CC: DM  HPI:  Aaron Guzman is a 55 y.o. past medical history listed below presenting for follow-up evaluation of his diabetes. For details of today's visit and the status of his chronic medical issues please refer to the assessment and plan.   Past Medical History:  Diagnosis Date   Diabetes mellitus without complication ([place])    Hyperlipidemia    Hypertension    Low testosterone    Review of Systems:   Review of Systems  Constitutional:  Positive for diaphoresis. Negative for chills and fever.  Respiratory:  Negative for shortness of breath.   Cardiovascular:  Negative for chest pain.  Gastrointestinal:  Negative for nausea and vomiting.  Neurological:  Negative for dizziness and weakness.    Physical Exam:  Vitals:   09/20/20 1005  BP: 114/72  Pulse: 95  Temp: 97.8 F (36.6 C)  TempSrc: Oral  SpO2: 98%  Weight: 204 lb 11.2 oz (92.9 kg)  Height: 5\' 6"  (1.676 m)   Physical Exam General: alert, appears stated age, in no acute distress HEENT: Normocephalic, atraumatic, EOM intact, conjunctiva normal CV: Regular rate and rhythm, no murmurs rubs or gallops Pulm: Clear to auscultation bilaterally, normal work of breathing Abdomen: Soft, nondistended, bowel sounds present, no tenderness to palpation MSK: No lower extremity edema Skin: Warm and dry Neuro: Alert and oriented x3   Assessment & Plan:   See Encounters Tab for problem based charting.  Patient discussed with Dr. Philipp Ovens

## 2020-09-20 NOTE — Addendum Note (Signed)
Addended by: Mike Craze on: 09/20/2020 11:26 AM   Modules accepted: Orders

## 2020-09-21 LAB — LIPID PANEL
Chol/HDL Ratio: 2.5 ratio (ref 0.0–5.0)
Cholesterol, Total: 84 mg/dL — ABNORMAL LOW (ref 100–199)
HDL: 34 mg/dL — ABNORMAL LOW (ref >39)
LDL Chol Calc (NIH): 31 mg/dL (ref 0–99)
Triglycerides: 97 mg/dL (ref 0–149)
VLDL Cholesterol Cal: 19 mg/dL (ref 5–40)

## 2020-09-25 ENCOUNTER — Telehealth: Payer: Self-pay

## 2020-09-25 NOTE — Telephone Encounter (Signed)
Returned call to [place] at [facility] in [place]. States they received a Rx for Trulicity 1.44 mg, inject 1.5 mg. Explained the 1.5 mg Rx was sent to [facility] in [place]. He was able to see the Rx in his system and will transfer it to his store in [place].

## 2020-09-25 NOTE — Telephone Encounter (Signed)
Constance with [facility] requesting to speak with a nurse about  Dulaglutide (TRULICITY) 1.5 UI/4.7VV SOPN.  Please call the pharmacy back.

## 2020-11-05 ENCOUNTER — Telehealth: Payer: Self-pay | Admitting: Student

## 2020-11-05 NOTE — Telephone Encounter (Signed)
Pt requesting a call back about his Trulicity medication.  Patient sates he can not afford his medication as it is over $3000.00 and he does not have any insurance.  Patient states he will not be able to enroll until Jan for his insurance.  Patient requesting what to do.  Please call back.

## 2020-11-05 NOTE — Telephone Encounter (Signed)
MULTIPLAN PHCS POLICY:  [id number]    PRIMARY PAYOR:          [facility] MULTIPLAN GROUP:  [id number]    PAYOR ADDRESS: [id number] [place] [address]                                                         Pre-cert number: N/A    PAYOR PHONE: [phone] APP DAYS:       Margit Banda, the above is listed on patient's Demographics. Can you tell if it is active? Thank you.

## 2020-11-06 ENCOUNTER — Telehealth: Payer: Self-pay

## 2020-11-06 NOTE — Telephone Encounter (Signed)
Insurance company would have to called to see if he is still active with this plan.

## 2020-11-06 NOTE — Telephone Encounter (Signed)
Left vm regarding follow up on expensive medication (trulicity). Wanted to discuss insurance and PAP options.  Call back [phone]

## 2020-11-06 NOTE — Telephone Encounter (Signed)
Pt returned phone call.  Pt explained that his job switched up his insurance and he has to wait until the next enrollment period to start over. Pt is interested in applying for assistance through [employer]. Pt can come by office today to pickup application & samples of tulicity 1.'5mg'$ .  Medication Samples have been provided to the patient.  Drug name: TRULICITY       Strength: 1.'5MG'$         Qty: 2 BOXES (4 PENS)  LOT: [id number]  Exp.Date: 01/10/2021  Dosing instructions: INJECT 1.'5MG'$  ONCE WEEKLY  [place] 10:24 AM 11/06/2020

## 2020-11-06 NOTE — Progress Notes (Signed)
Pt presented to pickup samples and sign application.  Submitted application for TRULICITY 1.'5MG'$ /0.5ML to [place] for patient assistance.   Phone: [phone]

## 2020-11-07 NOTE — Progress Notes (Signed)
Received notification from [place] regarding patient assistance DENIAL for TRULICITY 1.'5MG'$ /0.5ML.   PT DOES NOT MEET FINANCIAL CRITERIA FOR PROGRAM  Any other medication options? I know Victoza is listed as one of the IM program drugs at Outpatient pharmacy.

## 2020-11-08 NOTE — Progress Notes (Signed)
I called and spoke to patient.  He is in the process of relocating to [place] and starting a new job.  States that he has a job lined up and hopefully obtaining insurance very soon.  States that he has 3 weeks of Trulicity left.  He still has enough supply of his oral medication.  Advised patient to call us back in 2 weeks to update [place] on the insurance status.  If he has new insurance, I will refill his Trulicity.  If he cannot obtain insurance coverage, we will try Byetta injection under IM program at [facility] outpatient pharmacy.

## 2020-11-21 ENCOUNTER — Ambulatory Visit: Payer: Self-pay | Admitting: Internal Medicine

## 2020-11-21 ENCOUNTER — Encounter: Payer: Self-pay | Admitting: Internal Medicine

## 2020-11-21 DIAGNOSIS — E119 Type 2 diabetes mellitus without complications: Secondary | ICD-10-CM

## 2020-11-21 NOTE — Progress Notes (Signed)
Patient presented today needing a copy of his recent A1c. I will no charge his visit and set him up for a 1 month follow up.   Lawerance Cruel, D.O.  Internal Medicine Resident, PGY-3 [facility]  Pager: [phone] 4:09 PM, 11/21/2020

## 2020-11-22 ENCOUNTER — Encounter: Payer: Self-pay | Admitting: Student

## 2020-12-05 ENCOUNTER — Telehealth: Payer: Self-pay | Admitting: Dietician

## 2020-12-05 NOTE — Telephone Encounter (Signed)
Needs help with injecting Trulicity. He is getting knots in his arms and legs, he thinks he may not be injecting it properly.

## 2020-12-06 ENCOUNTER — Other Ambulatory Visit: Payer: Self-pay

## 2020-12-06 ENCOUNTER — Telehealth: Payer: Self-pay | Admitting: Pharmacist

## 2020-12-06 MED ORDER — TRULICITY 3 MG/0.5ML ~~LOC~~ SOAJ
3.0000 mg | SUBCUTANEOUS | 0 refills | Status: DC
  Filled 2020-12-06 – 2020-12-14 (×2): qty 2, 28d supply, fill #0

## 2020-12-06 NOTE — Telephone Encounter (Signed)
Called Aaron Guzman. He took his last Trulicity last night and got help injecting into the back of his arm instead of his shoulder. It went better. We reviewed the steps in injecting Trulicity and places he could use to inject. He verbalized understanding and agreed to have written information mailed to him.  He says he is currently uninsured and will not be abel to afford the Trulicity. I referred him to our pharmacy team to see if he can get it through patient assistance.

## 2020-12-06 NOTE — Telephone Encounter (Signed)
No, he did not mention your visit at all.

## 2020-12-06 NOTE — Telephone Encounter (Signed)
Called patient to discuss with him Trulicity.  Patient took last dose yesterday of 1.'5mg'$  and was denied PAP due to income. He started a new job and his insurance does not kick in for 30 days so he is currently still uninsured. Will send in one month supply to [facility] for Trulicity '3mg'$ . Patient reports tolerating 1.'5mg'$  dose and last A1C was above goal therefore appropriate to increase dose at this time.

## 2020-12-13 ENCOUNTER — Other Ambulatory Visit: Payer: Self-pay

## 2020-12-14 ENCOUNTER — Other Ambulatory Visit: Payer: Self-pay

## 2020-12-14 ENCOUNTER — Other Ambulatory Visit (HOSPITAL_COMMUNITY): Payer: Self-pay

## 2021-01-21 ENCOUNTER — Telehealth: Payer: Self-pay | Admitting: Student

## 2021-01-21 NOTE — Telephone Encounter (Signed)
Pt reporting Nausea and Vomiting  x 3 -4 month since taking the following medication.  Pt states he no longer wants to take this medication and wants to go back to taking the "pill".       Dulaglutide (TRULICITY) 3 JD/0.5XG SOPN

## 2021-01-22 NOTE — Telephone Encounter (Signed)
Contacted the patient this morning via telephone to schedule an appointment.  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  States when he gets back into town he will call back Friday to schedule an appointment for next week.

## 2021-02-11 ENCOUNTER — Encounter: Payer: Self-pay | Admitting: Gastroenterology

## 2021-02-26 ENCOUNTER — Encounter: Payer: Self-pay | Admitting: Student

## 2021-03-14 ENCOUNTER — Encounter: Payer: Self-pay | Admitting: Student

## 2021-04-01 ENCOUNTER — Other Ambulatory Visit: Payer: Self-pay

## 2021-04-01 ENCOUNTER — Ambulatory Visit (AMBULATORY_SURGERY_CENTER): Payer: No Typology Code available for payment source

## 2021-04-01 VITALS — Ht 66.0 in | Wt 200.0 lb

## 2021-04-01 DIAGNOSIS — Z1211 Encounter for screening for malignant neoplasm of colon: Secondary | ICD-10-CM

## 2021-04-01 MED ORDER — NA SULFATE-K SULFATE-MG SULF 17.5-3.13-1.6 GM/177ML PO SOLN: 1.0000 | Freq: Once | ORAL | 0 refills | Status: AC

## 2021-04-01 NOTE — Progress Notes (Signed)
Pre visit completed via phone call; Patient verified name, DOB, and address; No egg or soy allergy known to patient  No issues known to pt with past sedation with any surgeries or procedures Patient denies ever being told they had issues or difficulty with intubation  No FH of Malignant Hyperthermia Pt is not on diet pills Pt is not on home 02  Pt is not on blood thinners  Pt reports issues with constipation - patient advised to increase po fluids, activity, and to take Miralax BID x 5 days prior to prep No A fib or A flutter Pt is fully vaccinated for Covid; NO PA's for preps discussed with pt in PV today  Discussed with pt there will be an out-of-pocket cost for prep and that varies from $0 to 70 + dollars - pt verbalized understanding  Due to the COVID-19 pandemic we are asking patients to follow certain guidelines in PV and the [place]   Pt aware of COVID protocols and [facility] guidelines

## 2021-04-03 ENCOUNTER — Telehealth: Payer: Self-pay | Admitting: Student

## 2021-04-03 NOTE — Telephone Encounter (Signed)
Return pt's call who stated he has not been taking Trulicity b/c it made him sick ;"very sick" and put made knots on his stomach. But he is taking [place], watching his diet and walking. Stopped Trulicity x 4 months. He knows he needs to check his A1C. He's a truck driver; currently in [place]. He will be in town next week. Colonoscopy scheduled 1/23. He agreed to schedule an appt here - call transferred to front office - appt has been scheduled 1/20 with Eulas Post @ 1045 AM.

## 2021-04-03 NOTE — Telephone Encounter (Signed)
Please call the patient back about his medications as he has questions.

## 2021-04-03 NOTE — Telephone Encounter (Signed)
Please

## 2021-04-11 ENCOUNTER — Encounter: Payer: Self-pay | Admitting: Gastroenterology

## 2021-04-12 ENCOUNTER — Encounter: Payer: Self-pay | Admitting: Student

## 2021-04-12 ENCOUNTER — Ambulatory Visit (INDEPENDENT_AMBULATORY_CARE_PROVIDER_SITE_OTHER): Payer: PRIVATE HEALTH INSURANCE | Admitting: Student

## 2021-04-12 VITALS — BP 130/81 | HR 89 | Temp 97.8°F | Ht 66.0 in | Wt 198.4 lb

## 2021-04-12 DIAGNOSIS — E872 Acidosis, unspecified: Secondary | ICD-10-CM

## 2021-04-12 DIAGNOSIS — E119 Type 2 diabetes mellitus without complications: Secondary | ICD-10-CM

## 2021-04-12 DIAGNOSIS — Z0000 Encounter for general adult medical examination without abnormal findings: Secondary | ICD-10-CM

## 2021-04-12 LAB — POCT GLYCOSYLATED HEMOGLOBIN (HGB A1C): Hemoglobin A1C: 8.7 % — AB (ref 4.0–5.6)

## 2021-04-12 LAB — GLUCOSE, CAPILLARY: Glucose-Capillary: 286 mg/dL — ABNORMAL HIGH (ref 70–99)

## 2021-04-12 MED ORDER — RYBELSUS 3 MG PO TABS: 3.0000 mg | ORAL_TABLET | Freq: Every day | ORAL | 6 refills | Status: DC

## 2021-04-12 NOTE — Patient Instructions (Signed)
For your diabetes we started you on a medication called rybelsus we will start you on 3mg  daily and after 1 month increase this dose to 7mg  daily. This is in addition to your farxiga and dieting and exercise.

## 2021-04-13 ENCOUNTER — Encounter: Payer: Self-pay | Admitting: Certified Registered Nurse Anesthetist

## 2021-04-13 LAB — MICROALBUMIN / CREATININE URINE RATIO
Creatinine, Urine: 65.6 mg/dL
Microalb/Creat Ratio: <5 mg/g creat (ref 0–29)
Microalbumin, Urine: <3 ug/mL

## 2021-04-13 LAB — BMP8+ANION GAP
Anion Gap: 20 mmol/L — ABNORMAL HIGH (ref 10.0–18.0)
BUN/Creatinine Ratio: 11 (ref 9–20)
BUN: 12 mg/dL (ref 6–24)
CO2: 17 mmol/L — ABNORMAL LOW (ref 20–29)
Calcium: 9.7 mg/dL (ref 8.7–10.2)
Chloride: 102 mmol/L (ref 96–106)
Creatinine, Ser: 1.06 mg/dL (ref 0.76–1.27)
Glucose: 206 mg/dL — ABNORMAL HIGH (ref 70–99)
Potassium: 4.1 mmol/L (ref 3.5–5.2)
Sodium: 139 mmol/L (ref 134–144)
eGFR: 82 mL/min/{1.73_m2} (ref >59)

## 2021-04-15 ENCOUNTER — Ambulatory Visit (AMBULATORY_SURGERY_CENTER): Payer: PRIVATE HEALTH INSURANCE | Admitting: Gastroenterology

## 2021-04-15 ENCOUNTER — Encounter: Payer: Self-pay | Admitting: Gastroenterology

## 2021-04-15 ENCOUNTER — Other Ambulatory Visit: Payer: Self-pay

## 2021-04-15 ENCOUNTER — Telehealth: Payer: Self-pay

## 2021-04-15 VITALS — BP 105/51 | HR 67 | Temp 98.1°F | Resp 10 | Ht 66.0 in | Wt 200.0 lb

## 2021-04-15 DIAGNOSIS — D122 Benign neoplasm of ascending colon: Secondary | ICD-10-CM

## 2021-04-15 DIAGNOSIS — D123 Benign neoplasm of transverse colon: Secondary | ICD-10-CM

## 2021-04-15 DIAGNOSIS — Z1211 Encounter for screening for malignant neoplasm of colon: Secondary | ICD-10-CM | POA: Diagnosis present

## 2021-04-15 MED ORDER — SODIUM CHLORIDE 0.9 % IV SOLN: 500.0000 mL | Freq: Once | INTRAVENOUS | Status: DC

## 2021-04-15 NOTE — Progress Notes (Signed)
[facility] History and Physical   Primary Care Physician:  Gaylan Gerold, DO   Reason for Procedure:   Colon cancer screening  Plan:    colonoscopy     HPI: Aaron Guzman is a 56 y.o. male  here for colonoscopy screening. History of some constipation but otherwise no new changes. No family history of colon cancer known. Otherwise feels well without any cardiopulmonary symptoms.    Past Medical History:  Diagnosis Date   Arthritis    fingers   Diabetes mellitus without complication ([place])    on meds   Hyperlipidemia    on meds   Hypertension    on meds   Low testosterone     Past Surgical History:  Procedure Laterality Date   COLONOSCOPY  2000   FINGER SURGERY Left 1991   INGUINAL HERNIA REPAIR Left 1990   [facility]    Prior to Admission medications   Medication Sig Start Date End Date Taking? Authorizing Provider  Acetaminophen (TYLENOL ARTHRITIS PAIN PO) Take 2 tablets by mouth daily at 6 (six) AM.   Yes [provider]  BAYER ASPIRIN EC LOW DOSE PO Take 2 tablets by mouth daily at 6 (six) AM.   Yes [provider]  Continuous Blood Gluc Sensor (FREESTYLE LIBRE 2 SENSOR) MISC Check blood sugar at least 4 times a day 06/27/20  Yes Jeralyn Bennett, MD  dapagliflozin propanediol (FARXIGA) 10 MG TABS tablet Take 1 tablet (10 mg total) by mouth daily before breakfast. 09/20/20  Yes Rehman, Areeg N, DO  lisinopril (ZESTRIL) 2.5 MG tablet Take 1 tablet (2.5 mg total) by mouth daily. 09/20/20 04/15/21 Yes Rehman, Areeg N, DO  Multiple Vitamin (MULTIVITAMIN PO) Take 1 tablet by mouth daily at 6 (six) AM.   Yes [provider]  rosuvastatin (CRESTOR) 20 MG tablet Take 1 tablet (20 mg total) by mouth daily. 09/20/20 09/20/21 Yes Rehman, Areeg N, DO  Semaglutide (RYBELSUS) 3 MG TABS Take 3 mg by mouth daily. After 30 days please increase dose to 7mg  daily 04/12/21  Yes Rick Duff, MD    Current Outpatient Medications   Medication Sig Dispense Refill   Acetaminophen (TYLENOL ARTHRITIS PAIN PO) Take 2 tablets by mouth daily at 6 (six) AM.     BAYER ASPIRIN EC LOW DOSE PO Take 2 tablets by mouth daily at 6 (six) AM.     Continuous Blood Gluc Sensor (FREESTYLE LIBRE 2 SENSOR) MISC Check blood sugar at least 4 times a day 6 each 3   dapagliflozin propanediol (FARXIGA) 10 MG TABS tablet Take 1 tablet (10 mg total) by mouth daily before breakfast. 90 tablet 3   lisinopril (ZESTRIL) 2.5 MG tablet Take 1 tablet (2.5 mg total) by mouth daily. 90 tablet 2   Multiple Vitamin (MULTIVITAMIN PO) Take 1 tablet by mouth daily at 6 (six) AM.     rosuvastatin (CRESTOR) 20 MG tablet Take 1 tablet (20 mg total) by mouth daily. 90 tablet 3   Semaglutide (RYBELSUS) 3 MG TABS Take 3 mg by mouth daily. After 30 days please increase dose to 7mg  daily 30 tablet 6   Current Facility-Administered Medications  Medication Dose Route Frequency Provider Last Rate Last Admin   0.9 %  sodium chloride infusion  500 mL Intravenous Once Saahas Hidrogo, Carlota Raspberry, MD        Allergies as of 04/15/2021   (No Known Allergies)    Family History  Problem Relation Age of Onset   Diabetes  Mother    Cancer Mother    Diabetes Father    Cancer Father    Colon polyps Neg Hx    Colon cancer Neg Hx    Esophageal cancer Neg Hx    Rectal cancer Neg Hx    Stomach cancer Neg Hx     Social History   Socioeconomic History   Marital status: Single    Spouse name: Not on file   Number of children: Not on file   Years of education: Not on file   Highest education level: Not on file  Occupational History   Not on file  Tobacco Use   Smoking status: Every Day    Types: Cigars   Smokeless tobacco: Not on file   Tobacco comments:    1 pack a month  Vaping Use   Vaping Use: Never used  Substance and Sexual Activity   Alcohol use: Not Currently    Comment: Occasional   Drug use: No   Sexual activity: Not on file  Other Topics Concern   Not on  file  Social History Narrative   Not on file   Social Determinants of Health   Financial Resource Strain: Not on file  Food Insecurity: Not on file  Transportation Needs: Not on file  Physical Activity: Not on file  Stress: Not on file  Social Connections: Not on file  Intimate Partner Violence: Not on file    Review of Systems: All other review of systems negative except as mentioned in the HPI.  Physical Exam: Vital signs BP (!) 130/52    Pulse 69    Temp 98.1 F (36.7 C)    Resp 16    Ht 5\' 6"  (1.676 m)    Wt 200 lb (90.7 kg)    SpO2 97%    BMI 32.28 kg/m   General:   Alert,  Well-developed, pleasant and cooperative in NAD Lungs:  Clear throughout to auscultation.   Heart:  Regular rate and rhythm Abdomen:  Soft, nontender and nondistended.   Neuro/Psych:  Alert and cooperative. Normal mood and affect. A and O x 3  Jolly Mango, MD [facility]

## 2021-04-15 NOTE — Progress Notes (Signed)
Report given to PACU, vss 

## 2021-04-15 NOTE — Telephone Encounter (Signed)
Requesting PA on Semaglutide (RYBELSUS) 3 MG TABS.

## 2021-04-15 NOTE — Patient Instructions (Signed)
Discharge instructions given. Handouts on polyps,diverticulosis and hemorrhoids. Resume previous medications. See Recommendations. YOU HAD AN ENDOSCOPIC PROCEDURE TODAY AT [facility]:   Refer to the procedure report that was given to you for any specific questions about what was found during the examination.  If the procedure report does not answer your questions, please call your gastroenterologist to clarify.  If you requested that your care partner not be given the details of your procedure findings, then the procedure report has been included in a sealed envelope for you to review at your convenience later.  YOU SHOULD EXPECT: Some feelings of bloating in the abdomen. Passage of more gas than usual.  Walking can help get rid of the air that was put into your GI tract during the procedure and reduce the bloating. If you had a lower endoscopy (such as a colonoscopy or flexible sigmoidoscopy) you may notice spotting of blood in your stool or on the toilet paper. If you underwent a bowel prep for your procedure, you may not have a normal bowel movement for a few days.  Please Note:  You might notice some irritation and congestion in your nose or some drainage.  This is from the oxygen used during your procedure.  There is no need for concern and it should clear up in a day or so.  SYMPTOMS TO REPORT IMMEDIATELY:  Following lower endoscopy (colonoscopy or flexible sigmoidoscopy):  Excessive amounts of blood in the stool  Significant tenderness or worsening of abdominal pains  Swelling of the abdomen that is new, acute  Fever of 100F or higher   For urgent or emergent issues, a gastroenterologist can be reached at any hour by calling [phone]. Do not use MyChart messaging for urgent concerns.    DIET:  We do recommend a small meal at first, but then you may proceed to your regular diet.  Drink plenty of fluids but you should avoid alcoholic beverages for 24  hours.  ACTIVITY:  You should plan to take it easy for the rest of today and you should NOT DRIVE or use heavy machinery until tomorrow (because of the sedation medicines used during the test).    FOLLOW UP: Our staff will call the number listed on your records 48-72 hours following your procedure to check on you and address any questions or concerns that you may have regarding the information given to you following your procedure. If we do not reach you, we will leave a message.  We will attempt to reach you two times.  During this call, we will ask if you have developed any symptoms of COVID 19. If you develop any symptoms (ie: fever, flu-like symptoms, shortness of breath, cough etc.) before then, please call [phone].  If you test positive for Covid 19 in the 2 weeks post procedure, please call and report this information to [place].    If any biopsies were taken you will be contacted by phone or by letter within the next 1-3 weeks.  Please call us at [phone] if you have not heard about the biopsies in 3 weeks.    SIGNATURES/CONFIDENTIALITY: You and/or your care partner have signed paperwork which will be entered into your electronic medical record.  These signatures attest to the fact that that the information above on your After Visit Summary has been reviewed and is understood.  Full responsibility of the confidentiality of this discharge information lies with you and/or your care-partner.

## 2021-04-15 NOTE — Progress Notes (Signed)
Pt's states no medical or surgical changes since previsit or office visit.   VS taken by CW 

## 2021-04-15 NOTE — Op Note (Addendum)
Aaron Guzman Patient Name: Aaron Guzman Procedure Date: 04/15/2021 8:24 AM MRN: [id number] Endoscopist: Remo Lipps P. Havery Moros , MD Age: 56 Referring MD:  Date of Birth: 1966/02/17 Gender: Male Account #: [id number] Procedure:                Colonoscopy Indications:              Screening for colorectal malignant neoplasm Medicines:                Monitored Anesthesia Care Procedure:                Pre-Anesthesia Assessment:                           - Prior to the procedure, a History and Physical                            was performed, and patient medications and                            allergies were reviewed. The patient's tolerance of                            previous anesthesia was also reviewed. The risks                            and benefits of the procedure and the sedation                            options and risks were discussed with the patient.                            All questions were answered, and informed consent                            was obtained. Prior Anticoagulants: The patient has                            taken no previous anticoagulant or antiplatelet                            agents. ASA Grade Assessment: II - A patient with                            mild systemic disease. After reviewing the risks                            and benefits, the patient was deemed in                            satisfactory condition to undergo the procedure.                           After obtaining informed consent, the colonoscope  was passed under direct vision. Throughout the                            procedure, the patient's blood pressure, pulse, and                            oxygen saturations were monitored continuously. The                            Olympus CF-HQ190L [id number]) Colonoscope was                            introduced through the anus and advanced to the the                            cecum,  identified by appendiceal orifice and                            ileocecal valve. The colonoscopy was performed                            without difficulty. The patient tolerated the                            procedure well. The quality of the bowel                            preparation was good. The ileocecal valve,                            appendiceal orifice, and rectum were photographed. Scope In: 8:30:12 AM Scope Out: 8:50:36 AM Scope Withdrawal Time: 0 hours 17 minutes 53 seconds  Total Procedure Duration: 0 hours 20 minutes 24 seconds  Findings:                 The perianal and digital rectal examinations were                            normal.                           A few small-mouthed diverticula were found in the                            right colon.                           A diminutive polyp was found in the ascending                            colon. The polyp was sessile. The polyp was removed                            with a cold snare. Resection and retrieval were  complete.                           Three sessile polyps were found in the hepatic                            flexure. The polyps were 3 mm in size. These polyps                            were removed with a cold snare. Resection and                            retrieval were complete.                           A diminutive polyp was found in the transverse                            colon. The polyp was flat. The polyp was removed                            with a cold snare. Resection and retrieval were                            complete.                           Internal hemorrhoids were found during retroflexion.                           The colon was spastic. The exam was otherwise                            without abnormality. Complications:            No immediate complications. Estimated blood loss:                            Minimal. Estimated Blood Loss:      Estimated blood loss was minimal. Impression:               - Diverticulosis in the right colon.                           - One diminutive polyp in the ascending colon,                            removed with a cold snare. Resected and retrieved.                           - Three 3 mm polyps at the hepatic flexure, removed                            with a cold snare. Resected and retrieved.                           -  One diminutive polyp in the transverse colon,                            removed with a cold snare. Resected and retrieved.                           - Internal hemorrhoids.                           - Spastic colon                           - The examination was otherwise normal. Recommendation:           - Patient has a contact number available for                            emergencies. The signs and symptoms of potential                            delayed complications were discussed with the                            patient. Return to normal activities tomorrow.                            Written discharge instructions were provided to the                            patient.                           - Resume previous diet.                           - Continue present medications.                           - Recommend trial of Miralax daily to twice daily                            to treat reported constipation                           - Await pathology results. Remo Lipps P. Aaron Rau, MD 04/15/2021 8:55:00 AM This report has been signed electronically.

## 2021-04-16 NOTE — Assessment & Plan Note (Addendum)
Patient was last seen in our clinic 08/2020. At that time his A1c was 8.5. It was discussed with patient that he should increase his trulicity to 1.5mg  weekly and continue dapagliflozin 10mg  qd. He states he was adherent with his medications until 12/2020. At that time he notes he had significant nausea and he stopped this medication.   His A1c this clinic visit is 8.7. He is not on metformin because he was noted to have a lactic acidosis from this 06/01/2020.   A/P: BGs remain poorly controlled. Patient would not like an injection. He is however okay with an additional oral medication.  -Added Rybelsus 3mg  with plans to increase to 7mg  if patient is tolerating this. Let him know this medication works the same was as Musician but he is will to trial the pill form.  -We also discussed weight loss.   Microalb/cr ratio WNL Plans to schedule eye exam with Dr. Gershon Crane BMP with mildly decreased kidney function and bicarb of 17 as well as elevated anion gap of 20. Previously bicarb was decreased and lactic acid was elevated. This was thought to be due to metformin and his bicarb and lactic acidosis normalized once off of this medication. Unclear cause at this time AGMA, his kidney dysfunction does not appear to be to the degree where it could be contributing to his acidosis. He is currently off of metformin and his blood pressures were normal during his most recent visit. Of most concern given his diabetes is DKA. Discussed with patient 1/24 that we will have him to come in and repeat BMP as well as obtain Beta hydroxybutyrate, and lactic acid at that time. Explained to patient that it is important for him to get these labs. 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 He did however state that he would discuss with them about returning early to get this lab work completed.

## 2021-04-16 NOTE — Progress Notes (Deleted)
° °  CC: F/u of his diabetes   HPI:  [name] is a 56 y.o. M with a pmh per below who presents for follow up of his T2DM. Please see problem based charting under encounters tab for further details.    Past Medical History:  Diagnosis Date   Arthritis    fingers   Diabetes mellitus without complication ([place])    on meds   Hyperlipidemia    on meds   Hypertension    on meds   Low testosterone    Review of Systems:  Please see problem based charting under encounters tab for further details.    Physical Exam:  Vitals:   04/12/21 0959  BP: 130/81  Pulse: 89  Temp: 97.8 F (36.6 C)  TempSrc: Oral  SpO2: 99%  Weight: 198 lb 6.4 oz (90 kg)  Height: 5\' 6"  (1.676 m)   Constitutional: Well-developed, well-nourished, and in no distress.  HENT:  Head: Normocephalic and atraumatic.  Eyes: EOM are normal.  Neck: Normal range of motion.  Cardiovascular: Normal rate, regular rhythm, intact distal pulses. No gallop and no friction rub.  No murmur heard. No lower extremity edema  Pulmonary: Non labored breathing on room air, no wheezing or rales  Abdominal: Soft. Normal bowel sounds. Non distended and non tender Musculoskeletal: Normal range of motion.        General: No tenderness or edema.  Neurological: Alert and oriented to person, place, and time. Non focal  Skin: Skin is warm and dry.    Assessment & Plan:   See Encounters Tab for problem based charting.  Patient discussed with Dr. Philipp Ovens

## 2021-04-17 ENCOUNTER — Telehealth: Payer: Self-pay | Admitting: *Deleted

## 2021-04-17 NOTE — Telephone Encounter (Signed)
°  Follow up Call-  Call back number 04/15/2021  Post procedure Call Back phone  # [phone]  Permission to leave phone message Yes  Some recent data might be hidden     Patient questions:  Do you have a fever, pain , or abdominal swelling? No. Pain Score  0 *  Have you tolerated food without any problems? Yes.    Have you been able to return to your normal activities? Yes.    Do you have any questions about your discharge instructions: Diet   No. Medications  No. Follow up visit  No.  Do you have questions or concerns about your Care? No.  Actions: * If pain score is 4 or above: No action needed, pain <4.  Have you developed a fever since your procedure? no  2.   Have you had an respiratory symptoms (SOB or cough) since your procedure? no  3.   Have you tested positive for COVID 19 since your procedure no  4.   Have you had any family members/close contacts diagnosed with the COVID 19 since your procedure?  no   If yes to any of these questions please route to Joylene John, RN and Joella Prince, RN

## 2021-04-17 NOTE — Telephone Encounter (Signed)
°  Follow up Call-  Call back number 04/15/2021  Post procedure Call Back phone  # [phone]  Permission to leave phone message Yes  Some recent data might be hidden     Patient questions:  Do you have a fever, pain , or abdominal swelling? No. Pain Score  0 *  Have you tolerated food without any problems? Yes.    Have you been able to return to your normal activities? Yes.    Do you have any questions about your discharge instructions: Diet   No. Medications  No. Follow up visit  No.  Do you have questions or concerns about your Care? No.  Actions: * If pain score is 4 or above: No action needed, pain <4.  Have you developed a fever since your procedure? no  2.   Have you had an respiratory symptoms (SOB or cough) since your procedure? no  3.   Have you tested positive for COVID 19 since your procedure no  4.   Have you had any family members/close contacts diagnosed with the COVID 19 since your procedure?  no   If yes to any of these questions please route to Joylene John, RN and Joella Prince, RN

## 2021-04-18 NOTE — Progress Notes (Signed)
Internal Medicine Clinic Attending  Case discussed with Dr. Carter  At the time of the visit.  We reviewed the resident's history and exam and pertinent patient test results.  I agree with the assessment, diagnosis, and plan of care documented in the resident's note.  

## 2021-04-19 NOTE — Progress Notes (Signed)
° °  CC: F/u of his diabetes   HPI:  [name] is a 56 y.o. M with a pmh per below who presents for follow up of his T2DM. Please see problem based charting under encounters tab for further details.    Past Medical History:  Diagnosis Date   Arthritis    fingers   Diabetes mellitus without complication ([place])    on meds   Hyperlipidemia    on meds   Hypertension    on meds   Low testosterone    Review of Systems:  Please see problem based charting under encounters tab for further details.    Physical Exam:  Vitals:   04/12/21 0959  BP: 130/81  Pulse: 89  Temp: 97.8 F (36.6 C)  TempSrc: Oral  SpO2: 99%  Weight: 198 lb 6.4 oz (90 kg)  Height: 5\' 6"  (1.676 m)   Constitutional: Well-developed, well-nourished, and in no distress.  HENT:  Head: Normocephalic and atraumatic.  Eyes: EOM are normal.  Neck: Normal range of motion.  Cardiovascular: Normal rate, regular rhythm, intact distal pulses. No gallop and no friction rub.  No murmur heard. No lower extremity edema  Pulmonary: Non labored breathing on room air, no wheezing or rales  Abdominal: Soft. Normal bowel sounds. Non distended and non tender Musculoskeletal: Normal range of motion.        General: No tenderness or edema.  Neurological: Alert and oriented to person, place, and time. Non focal  Skin: Skin is warm and dry.    Assessment & Plan:   See Encounters Tab for problem based charting.  Patient discussed with Dr. Philipp Ovens

## 2021-04-24 ENCOUNTER — Other Ambulatory Visit: Payer: PRIVATE HEALTH INSURANCE

## 2021-04-30 NOTE — Telephone Encounter (Signed)
I have been  trying to get in touch with pt to get the correct insurance information even sent the pharmacy back the form stating that the insurance was incorrect on there end ... today pt finally called back  with his daughter on the phone / she is the one running the medication  and put the wrong state in .. she will fix tomorrow

## 2021-05-01 ENCOUNTER — Telehealth: Payer: Self-pay | Admitting: Internal Medicine

## 2021-05-01 NOTE — Telephone Encounter (Signed)
Pt called the hospital operator at ~10:30AM this morning requesting to speak to the [facility] provider on call. I returned his call. He expresses frustration regarding his inability to get Rybelsus due to some issues with insurance and paperwork. I explained to him that he needed to address this with the clinic by calling the [facility] front desk however he then said that he was having some issues with someone answering phones. I was unable to get a clear picture of the particular issue that he was having but let him know that I would notify the clinic staff to look into it further. He is not having issues picking up any of his other medications, aside from the Rybelsus.   I have forwarded his concerns to several members of our clinic staff to follow up.   Mitzi Hansen, MD 05/01/21 12:52 PM

## 2021-05-01 NOTE — Telephone Encounter (Signed)
I have gotten a  request for a pa  for pt  ( RYBLESUS )  a few weeks ago I sent the request with a note back to the pharmacy stating that the state on the request says [place] and there is no form being dropped down  to complete the PA.[name] pt called I explained to him that in order to complete a PA I need the pharmacy to correct the mistake on his end he hung up, then called back with his daughter on the phone after  a few back and forth with them I explained to the daughter again the PA was sent with the incorrect insurance carrier state at which point she stated the insurance pay for everything else .Marland Kitchen because she is the one to run it .Marland Kitchen so I explained again  in steps   1 .. the pharmacy tech  or pharmacist  runs the RX if it is not cover   2... it generates a form that comes to the office  with a key   3... I then go on cover my meds and finish  my part which is the drug , route , dosage , and doc info .  Long with office notes ...    And await for approval or denial..    BUT because the state is placed in the system as [place]  it is not allowing any form to drop ...   The daughter them stated when she gets to work she will run in again ...    ( I have no clue if she is working at the pharmacy   and if that is legal )   This is a copy and paste of what is on the cover my meds :  CoverMyMeds Your Preferences Verify Prescribers Help Privacy & Terms Logged in as [name] the Request You Need Need help?  Call us: [phone]  We were unable to choose a specific form Request key: [id number]. Use the search fields below to find the form.  Request Info Patient name Aaron Guzman Date of birth 1966-01-31 Medication Rybelsus 3MG  tablets Rejection code 76 Prescriber name PRINCETON CARTER By Drug Insurance ID card  Patient [place]  [id number] [place] Number  [phone] RxGroup [id number]   Find this on a card specific to drug coverage, or  on your patients medical card. Search with all 3 terms, or by the information available. Contact the pharmacy to obtain these if you don't have access to them.   Note: The Part D Member ID may also differ from the UID on the [employer].  By [employer] or PBM name  Patient Tourist information centre manager or PBM Name Forms No requests were found Verify the patient insurance state. Use an alternate name or spelling. Can't find what you need? Contact us via chat or by calling [phone]. CoverMyMeds provides forms to users of this service regardless of drug, brand, plan or other relationship (business or otherwise). If a form is unavailable, incorrect, or if you have questions, please contact us.

## 2021-05-03 ENCOUNTER — Telehealth: Payer: Self-pay

## 2021-05-03 NOTE — Telephone Encounter (Signed)
This is the copy and pasted of what cover my meds is saying   We were unable to choose a specific form Request key: [id number]. Use the search fields below to find the form.  Request Info Patient name [name] Date of birth Oct 18, 1965 Medication Rybelsus 3MG  tablets Rejection code 18 Prescriber name PRINCETON CARTER  By Drug Insurance ID card  [name]  [phone] RxBIN [id number] [place] Number [phone] RxGroup   Forms No requests were found Verify the patient insurance state. Use an alternate name or spelling.

## 2021-05-03 NOTE — Telephone Encounter (Signed)
Pt calling back about PA on want to know if we have received another form from [place] for the PA. Told the patient and the daughter we have not received the form. Pt states his daughter work at the pharmacy and she is the one that faxed the form to the clinic. Asked them to confirmed the fax number, he started to yell at me and called me ignorant. He become very upset and keeping on yelling on the phone. Told him to hold on and I will get my manager on the phone, he hung up the phone.

## 2021-05-07 ENCOUNTER — Encounter: Payer: Self-pay | Admitting: Nurse Practitioner

## 2021-05-07 ENCOUNTER — Ambulatory Visit: Payer: PRIVATE HEALTH INSURANCE | Admitting: Nurse Practitioner

## 2021-05-07 ENCOUNTER — Other Ambulatory Visit: Payer: Self-pay

## 2021-05-07 VITALS — BP 124/82 | HR 86 | Temp 97.8°F | Resp 12 | Ht 60.0 in | Wt 197.3 lb

## 2021-05-07 DIAGNOSIS — E119 Type 2 diabetes mellitus without complications: Secondary | ICD-10-CM | POA: Diagnosis not present

## 2021-05-07 DIAGNOSIS — I10 Essential (primary) hypertension: Secondary | ICD-10-CM

## 2021-05-07 DIAGNOSIS — M151 Heberden's nodes (with arthropathy): Secondary | ICD-10-CM | POA: Diagnosis not present

## 2021-05-07 DIAGNOSIS — E782 Mixed hyperlipidemia: Secondary | ICD-10-CM

## 2021-05-07 MED ORDER — RYBELSUS 3 MG PO TABS: 3.0000 mg | ORAL_TABLET | Freq: Every day | ORAL | 0 refills | Status: DC

## 2021-05-07 NOTE — Patient Instructions (Signed)
Nice to see you today. I will send over the medication to your pharmacy If you are doing well after being on the medication for a month we will get you on the next dose I want to see you in 3 months, sooner if you need me

## 2021-05-07 NOTE — Assessment & Plan Note (Signed)
Patient currently maintained on Farxiga 10 mg.  Looking back in the chart patient has been on glipizide in the past along with metformin.  Stated he was discontinuing metformin due to "acid in his blood".  Likely increasing lactic acid and concern for lactic acidosis.  Patient had been on Trulicity in the past and had injection site reactions.  Since then he was transitioned off of Trulicity and started on Rybelsus.  Those discrepancy with spelling of his name cannot get the prescription.  We will start patient on Rybelsus 3 mg for 30 days then titrate up to Rybelsus 7 mg daily after that point.  No history of pancreatitis, thyroid cancer per patient report.  He has a CGM uses to check his glucose.

## 2021-05-07 NOTE — Progress Notes (Signed)
New Patient Office Visit  Subjective:  Patient ID: Aaron Guzman, male    DOB: 1965/09/29  Age: 56 y.o. MRN: [id number]  CC:  Chief Complaint  Patient presents with   Establish Care    Was going to [facility]-in epic   Diabetes    Need RX for Rebylsis to be sent over to [facility].    HPI Aaron Guzman presents for [facility]  DM2: walks daily walks approx 30 min -1 hour. Currently truck driver. CGM. Currently on farxiga. Has tried metformin in the past but was taken off the medication presumably due to lactic acidosis. Looking at the chart has also been on glipizide in the past. Was on truicity but had injection site reactions. Recent hight glucoses of 300+ States that he will have sunkist soda and some coffee. Minimal water intake. States he does avoid bread but having a healthy diet is difficult being on the road a majority of time  HTN: historical dx. Currently on lisinopril 2.68m presumably for renal protection. BP good in office.    HLD: Currently on Crestor. Tolerating the medication well  OA: Has had CRP, ESR, and Uric acid done in the past and all negative. Has also had imaging performed on the right hand that showed OA. Currently maintained on tylenol OTC  Colonoscopy: 04/15/2021, with no recall letter in chart. Dr. AHavery MorosPSA: unsure of last result Past Medical History:  Diagnosis Date   Arthritis    fingers   Diabetes [place] complication [place]    on meds   Hyperlipidemia    on meds   Hypertension    on meds   Low testosterone     Past Surgical History:  Procedure Laterality Date   COLONOSCOPY  2000   FINGER SURGERY Left 1991   INGUINAL HERNIA REPAIR Left 1990   WISDOM TOOTH EXTRACTION  1987    Family History  Problem Relation Age of Onset   Diabetes Mother    Breast cancer Mother    Diabetes Father    Diabetes Sister    Diabetes Sister    Diabetes Maternal Uncle    Cancer Maternal Uncle        not sure of the type-it  was stage 4   Diabetes Paternal Grandmother    Diabetes Paternal Grandfather    Colon polyps Neg Hx    Colon cancer Neg Hx    Esophageal cancer Neg Hx    Rectal cancer Neg Hx    Stomach cancer Neg Hx     Social History   Socioeconomic History   Marital status: Single    Spouse name: Not on file   Number of children: Not on file   Years of education: Not on file   Highest education level: Not on file  Occupational History   Not on file  Tobacco Use   Smoking status: Every Day    Types: Cigars   Smokeless tobacco: Never   Tobacco comments:    1 pack a month  Vaping Use   Vaping Use: Never used  Substance and Sexual Activity   Alcohol use: Not Currently   Drug use: No   Sexual activity: Not on file  Other Topics Concern   Not on file  Social History Narrative   Not on file   Social Determinants of Health   Financial Resource Strain: Not on file  Food Insecurity: Not on file  Transportation Needs: Not on file  Physical Activity: Not on file  Stress: Not on file  Social Connections: Not on file  Intimate Partner Violence: Not on file    ROS Review of Systems  Constitutional:  Negative for chills, fatigue and fever.  Respiratory:  Negative for cough and shortness of breath.   Cardiovascular:  Negative for chest pain and leg swelling.  Gastrointestinal:  Negative for constipation, diarrhea, nausea and vomiting.       BM daily  Genitourinary:  Negative for difficulty urinating.       Nocturia x  Musculoskeletal:  Positive for arthralgias and joint swelling.  Neurological:  Negative for numbness and headaches.  Psychiatric/Behavioral:  Negative for hallucinations and suicidal ideas.    Objective:   Today's Vitals: BP 124/82    Pulse 86    Temp 97.8 F (36.6 C)    Resp 12    Ht 5' (1.524 m)    Wt 197 lb 5 oz (89.5 kg)    SpO2 95%    BMI 38.53 kg/m   Physical Exam Vitals and nursing note reviewed.  Constitutional:      Appearance: Normal appearance.  HENT:      Right Ear: Tympanic membrane, ear canal and external ear normal.     Left Ear: Tympanic membrane, ear canal and external ear normal.     Mouth/Throat:     Mouth: Mucous membranes are moist.     Pharynx: Oropharynx is clear.  Eyes:     Extraocular Movements: Extraocular movements intact.     Pupils: Pupils are equal, round, and reactive to light.  Neck:     Thyroid: No thyroid mass, thyromegaly or thyroid tenderness.  Cardiovascular:     Rate and Rhythm: Normal rate and regular rhythm.  Pulmonary:     Effort: Pulmonary effort is normal.     Breath sounds: Normal breath sounds.  Abdominal:     General: Bowel sounds are normal. There is no distension.     Palpations: There is no mass.     Tenderness: There is no abdominal tenderness.  Musculoskeletal:     Right lower leg: No edema.     Left lower leg: No edema.  Lymphadenopathy:     Cervical: No cervical adenopathy.  Skin:    General: Skin is warm.  Neurological:     General: No focal deficit present.     Mental Status: He is alert.     Deep Tendon Reflexes:     Reflex Scores:      Bicep reflexes are 2+ on the right side and 2+ on the left side.      Patellar reflexes are 2+ on the right side and 2+ on the left side.    Comments: Bilateral upper and lower extremity strength 5/5    Assessment & Plan:   Problem List Items Addressed This Visit       Cardiovascular and Mediastinum   Hypertension    Historical diagnosis in chart.  Patient blood pressure within normal limits.  On low-dose lisinopril presumably for renal protection with diabetes.  Patient will continue taking medication as prescribed      Relevant Medications   aspirin 325 MG tablet     Endocrine   Type 2 diabetes mellitus ([place])    Patient currently maintained on Farxiga 10 mg.  Looking back in the chart patient has been on glipizide in the past along with metformin.  Stated he was discontinuing metformin due to "acid in his blood".  Likely increasing  lactic acid and concern for lactic  acidosis.  Patient had been on Trulicity in the past and had injection site reactions.  Since then he was transitioned off of Trulicity and started on Rybelsus.  Those discrepancy with spelling of his name cannot get the prescription.  We will start patient on Rybelsus 3 mg for 30 days then titrate up to Rybelsus 7 mg daily after that point.  No history of pancreatitis, thyroid cancer per patient report.  He has a CGM uses to check his glucose.      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  Family history of same.  The family history of rheumatoid her is that we have.  Patient has had ESR, sed rate, uric acid performed in the past that were all negative.  Also had imaging the right hand that showed OA.  Continue taking Tylenol over-the-counter as needed      Relevant Medications   aspirin 325 MG tablet     Other   Hyperlipidemia - Primary    Currently maintained on rosuvastatin.  Patient tolerating medication well taking it as prescribed per patient report.  Continue medication      Relevant Medications   aspirin 325 MG tablet    Outpatient Encounter Medications as of 05/07/2021  Medication Sig   Acetaminophen (TYLENOL ARTHRITIS PAIN PO) Take 2 tablets by mouth daily at 6 (six) AM.   aspirin 325 MG tablet Take 650 mg by mouth daily.   Continuous Blood Gluc Sensor (FREESTYLE LIBRE 2 SENSOR) MISC Check blood sugar at least 4 times a day   dapagliflozin propanediol (FARXIGA) 10 MG TABS tablet Take 1 tablet (10 mg total) by mouth daily before breakfast.   lisinopril (ZESTRIL) 2.5 MG tablet Take 1 tablet (2.5 mg total) by mouth daily.   Multiple Vitamin (MULTIVITAMIN PO) Take 1 tablet by mouth daily at 6 (six) AM.   rosuvastatin (CRESTOR) 20 MG tablet Take 1 tablet (20 mg  total) by mouth daily.   Semaglutide (RYBELSUS) 3 MG TABS Take 3 mg by mouth daily. After 30 days please increase dose to 32m daily (Patient not taking: Reported on 05/07/2021)   [DISCONTINUED] BAYER ASPIRIN EC LOW DOSE PO Take 2 tablets by mouth daily at 6 (six) AM.   No facility-administered encounter medications on file as of 05/07/2021.    Follow-up: Return in about 3 months (around 08/04/2021) for DM recheck.   This visit occurred during the SARS-CoV-2 public health emergency.  Safety protocols were in place, including screening questions prior to the visit, additional usage of staff PPE, and extensive cleaning of exam room while observing appropriate contact time as indicated for disinfecting solutions.   MRomilda Garret NP

## 2021-05-07 NOTE — Assessment & Plan Note (Signed)
Historical diagnosis in chart.  Patient blood pressure within normal limits.  On low-dose lisinopril presumably for renal protection with diabetes.  Patient will continue taking medication as prescribed

## 2021-05-07 NOTE — Assessment & Plan Note (Signed)
History of OA in the past.  Family history of same.  The family history of rheumatoid her is that we have.  Patient has had ESR, sed rate, uric acid performed in the past that were all negative.  Also had imaging the right hand that showed OA.  Continue taking Tylenol over-the-counter as needed

## 2021-05-07 NOTE — Assessment & Plan Note (Signed)
Currently maintained on rosuvastatin.  Patient tolerating medication well taking it as prescribed per patient report.  Continue medication

## 2021-05-13 ENCOUNTER — Telehealth: Payer: Self-pay | Admitting: Nurse Practitioner

## 2021-05-13 NOTE — Telephone Encounter (Signed)
Spoke with Adriana Reams, patinet's daughter, ok per DPR on file, she called insurance and they told her PA is needed and need to go on [employer] website to get this done (not covermymeds). I went on the website and printed off PA form and faxed it to [place] [employer] at [phone]. Awaiting response.

## 2021-05-13 NOTE — Telephone Encounter (Signed)
Called [employer] and was advised that this medication does not need PA. I called [facility] and advised them of this. [facility] pharmacist are still getting response of PA needed in the system. They will try and call their pharmacy help desk number to see if this can be faxed on their end. Will wait to hear back if anything else is needed.

## 2021-05-13 NOTE — Telephone Encounter (Signed)
Pharmacy called they need a PA done on the Semaglutide (RYBELSUS) 3 MG TABS

## 2021-05-15 NOTE — Telephone Encounter (Signed)
PA approval 05/15/21-05/15/22. Pharmacist aware and RX was filled.

## 2021-06-18 ENCOUNTER — Telehealth: Payer: Self-pay | Admitting: Nurse Practitioner

## 2021-06-18 DIAGNOSIS — E119 Type 2 diabetes mellitus without complications: Secondary | ICD-10-CM

## 2021-06-18 DIAGNOSIS — E782 Mixed hyperlipidemia: Secondary | ICD-10-CM

## 2021-06-18 NOTE — Telephone Encounter (Signed)
?  Encourage patient to contact the pharmacy for refills or they can request refills through [facility] ? ?LAST APPOINTMENT DATE:  Please schedule appointment if longer than 1 year ? ?NEXT APPOINTMENT DATE: ? ?MEDICATION:lisinopril (ZESTRIL) 2.5 MG tablet,Continuous Blood Gluc Sensor (FREESTYLE LIBRE 2 SENSOR) MISC,dapagliflozin propanediol (FARXIGA) 10 MG TABS tablet,rosuvastatin (CRESTOR) 20 MG tablet, ?Semaglutide (RYBELSUS) 3 MG TABS ?Is the patient out of medication?  ? ?[place] ([place]), [place] ? ?Let patient know to contact pharmacy at the end of the day to make sure medication is ready. ? ?Please notify patient to allow 48-72 hours to process ? ?CLINICAL FILLS OUT ALL BELOW:  ? ?LAST REFILL: ? ?QTY: ? ?REFILL DATE: ? ? ? ?OTHER COMMENTS:  ? ? ?Okay for refill? ? ?Please advise ? ? ?  ?

## 2021-06-19 ENCOUNTER — Other Ambulatory Visit: Payer: Self-pay | Admitting: Nurse Practitioner

## 2021-06-19 DIAGNOSIS — E119 Type 2 diabetes mellitus without complications: Secondary | ICD-10-CM

## 2021-06-19 DIAGNOSIS — E782 Mixed hyperlipidemia: Secondary | ICD-10-CM

## 2021-06-19 MED ORDER — DAPAGLIFLOZIN PROPANEDIOL 10 MG PO TABS: 10.0000 mg | ORAL_TABLET | Freq: Every day | ORAL | 3 refills | Status: DC

## 2021-06-19 MED ORDER — RYBELSUS 7 MG PO TABS
7.0000 mg | ORAL_TABLET | Freq: Every day | ORAL | 1 refills | Status: DC
Start: 2021-06-19 — End: 2021-06-20

## 2021-06-19 MED ORDER — FREESTYLE LIBRE 2 SENSOR MISC: 3 refills | Status: DC

## 2021-06-19 MED ORDER — LISINOPRIL 2.5 MG PO TABS: 2.5000 mg | ORAL_TABLET | Freq: Every day | ORAL | 3 refills | Status: DC

## 2021-06-19 MED ORDER — ROSUVASTATIN CALCIUM 20 MG PO TABS
20.0000 mg | ORAL_TABLET | Freq: Every day | ORAL | 3 refills | Status: DC
Start: 2021-06-19 — End: 2021-06-20

## 2021-06-20 MED ORDER — RYBELSUS 7 MG PO TABS: 7.0000 mg | ORAL_TABLET | Freq: Every day | ORAL | 1 refills | Status: DC

## 2021-06-20 MED ORDER — FREESTYLE LIBRE 2 SENSOR MISC: 3 refills | Status: DC

## 2021-06-20 MED ORDER — DAPAGLIFLOZIN PROPANEDIOL 10 MG PO TABS: 10.0000 mg | ORAL_TABLET | Freq: Every day | ORAL | 3 refills | Status: DC

## 2021-06-20 MED ORDER — LISINOPRIL 2.5 MG PO TABS: 2.5000 mg | ORAL_TABLET | Freq: Every day | ORAL | 3 refills | Status: DC

## 2021-06-20 MED ORDER — ROSUVASTATIN CALCIUM 20 MG PO TABS: 20.0000 mg | ORAL_TABLET | Freq: Every day | ORAL | 3 refills | Status: DC

## 2021-06-20 NOTE — Telephone Encounter (Signed)
Pt called checking the status of his medication. Please advise. ?

## 2021-06-20 NOTE — Progress Notes (Signed)
Re sent to the correct pharmacy ?

## 2021-06-20 NOTE — Telephone Encounter (Signed)
Patient was advised that medications were refilled. But then found out that they were sent to the wrong pharmacy. This has been refilled to the correct pharmacy. ?

## 2021-06-20 NOTE — Addendum Note (Signed)
Addended by: Kris Mouton on: 06/20/2021 12:41 PM ? ? Modules accepted: Orders ? ?

## 2021-07-11 ENCOUNTER — Encounter: Payer: PRIVATE HEALTH INSURANCE | Admitting: Student

## 2021-07-25 ENCOUNTER — Telehealth: Payer: Self-pay | Admitting: Nurse Practitioner

## 2021-07-25 NOTE — Telephone Encounter (Signed)
noted 

## 2021-07-25 NOTE — Telephone Encounter (Signed)
Patient advised and he does have refill left with the pharmacy. Nothing further is needed ?

## 2021-07-25 NOTE — Telephone Encounter (Signed)
Pt called about Semaglutide (RYBELSUS) 7 MG TABS, He only has 1 pill left and was gonna get the new one filled but he wanted to know if he needed to go on the 14 instead of the 7 mg. Callback is [phone] ?

## 2021-07-25 NOTE — Telephone Encounter (Signed)
Continue with the '7mg'$  until office visit with labs. Does he have a refill for the 7s or does he need a new script? ?

## 2021-08-05 ENCOUNTER — Ambulatory Visit: Payer: PRIVATE HEALTH INSURANCE | Admitting: Nurse Practitioner

## 2021-08-05 VITALS — BP 112/70 | HR 91 | Temp 97.1°F | Resp 10 | Ht 60.0 in | Wt 198.4 lb

## 2021-08-05 DIAGNOSIS — Z125 Encounter for screening for malignant neoplasm of prostate: Secondary | ICD-10-CM | POA: Diagnosis not present

## 2021-08-05 DIAGNOSIS — M151 Heberden's nodes (with arthropathy): Secondary | ICD-10-CM

## 2021-08-05 DIAGNOSIS — E1165 Type 2 diabetes mellitus with hyperglycemia: Secondary | ICD-10-CM

## 2021-08-05 DIAGNOSIS — M79645 Pain in left finger(s): Secondary | ICD-10-CM

## 2021-08-05 DIAGNOSIS — M79644 Pain in right finger(s): Secondary | ICD-10-CM

## 2021-08-05 DIAGNOSIS — E782 Mixed hyperlipidemia: Secondary | ICD-10-CM

## 2021-08-05 LAB — POCT GLYCOSYLATED HEMOGLOBIN (HGB A1C): Hemoglobin A1C: 11.8 % — AB (ref 4.0–5.6)

## 2021-08-05 NOTE — Progress Notes (Signed)
? ?Established Patient Office Visit ? ?Subjective   ?Patient ID: Aaron Guzman, male    DOB: 21-Jan-1966  Age: 56 y.o. MRN: [id number] ? ?Chief Complaint  ?Patient presents with  ? Diabetes  ?  Follow up  ? ? ? ? ?DM: Farxiga, rybellus, states he is taking medication as prescribed.  Upon further investigation patient has been taking Rybelsus with other medications in the morning so not on empty stomach.  We will correct that and titrate patient up to 14 mg next  ?Walking daily for approx 30 mins a day while at work ?Has a CGM has been checking the sugar approx 200-300 with CGM no low readings ?States that he is still drinking sunkist sodas. Limiting fried foods.  He has never talked to a diabetes nutritionist or educator in the past. ? ? ?Bilateral DIP pain. History of arthritis per patient report.  States that will wax and wane calling but patient describes as flareups.  Her breathing nodules present on exam today. ? ? ?Review of Systems  ?Constitutional:  Negative for chills and fever.  ?Respiratory:  Negative for shortness of breath.   ?Cardiovascular:  Negative for chest pain and leg swelling.  ?Gastrointestinal:  Negative for diarrhea, nausea and vomiting.  ?Genitourinary:  Positive for frequency. Negative for dysuria and hematuria.  ?Neurological:  Negative for dizziness and headaches.  ? ?  ?Objective:  ?  ? ?BP 112/70   Pulse 91   Temp (!) 97.1 ?F (36.2 ?C)   Resp 10   Ht 5' (1.524 m)   Wt 198 lb 6 oz (90 kg)   SpO2 96%   BMI 38.74 kg/m?  ?BP Readings from Last 3 Encounters:  ?08/05/21 112/70  ?05/07/21 124/82  ?04/15/21 (!) 105/51  ? ?Wt Readings from Last 3 Encounters:  ?08/05/21 198 lb 6 oz (90 kg)  ?05/07/21 197 lb 5 oz (89.5 kg)  ?04/15/21 200 lb (90.7 kg)  ? ?  ? ?Physical Exam ?Vitals and nursing note reviewed.  ?Constitutional:   ?   Appearance: Normal appearance.  ?Cardiovascular:  ?   Rate and Rhythm: Normal rate and regular rhythm.  ?   Pulses: Normal pulses.     ?     Dorsalis pedis pulses  are 2+ on the right side and 2+ on the left side.  ?   Heart sounds: Normal heart sounds.  ?Pulmonary:  ?   Effort: Pulmonary effort is normal.  ?   Breath sounds: Normal breath sounds.  ?Musculoskeletal:  ?   Right lower leg: No edema.  ?   Left lower leg: No edema.  ?   Comments: [place] nodules located on right index finger PIP and left middle finger PIP  ?Feet:  ?   Right foot:  ?   Skin integrity: Skin integrity normal.  ?   Left foot:  ?   Skin integrity: Skin integrity normal.  ?Skin: ?   General: Skin is warm.  ?Neurological:  ?   Mental Status: He is alert.  ? ? ? ?Results for orders placed or performed in visit on 08/05/21  ?POCT glycosylated hemoglobin (Hb A1C)  ?Result Value Ref Range  ? Hemoglobin A1C 11.8 (A) 4.0 - 5.6 %  ? HbA1c POC (<> result, manual entry)    ? HbA1c, POC (prediabetic range)    ? HbA1c, POC (controlled diabetic range)    ? ? ? ? ?The ASCVD Risk score (Arnett DK, et al., 2019) failed to calculate for the following reasons: ?  The valid total cholesterol range is 130 to 320 mg/dL ? ?  ?Assessment & Plan:  ? ?Problem List Items Addressed This Visit   ? ?  ? Endocrine  ? Type 2 diabetes mellitus ([place]) - Primary  ?  Patient states has been taking medications as prescribed but was not receptive to Rybelsus from other medications.  This likely been ineffective while taking it.  Did review the proper administration instructions with patient and he will correct what is been doing.  But given A1c still high likely will titrate up to 14 mg Rybelsus next refill.  Patient continue checking glucose with CGM and report if he has consistently above 300 readings.  Continue taking Rybelsus and [place].  Will refer to diabetes educator and nutritionist ? ?  ?  ? Relevant Orders  ? POCT glycosylated hemoglobin (Hb A1C) (Completed)  ? CBC  ? Comprehensive metabolic panel  ? PSA  ? Referral to Nutrition and Diabetes Services  ?  ? Musculoskeletal and Integument  ? Osteoarthritis of distal  interphalangeal (DIP) joint of right middle finger  ?  Historical diagnosis, flaring up today pending RF and ANA ? ?  ?  ?  ? Other  ? Hyperlipidemia  ?  Continue working on diet and exercise.  Continue taking rosuvastatin.  Will refer to diabetes educator ? ?  ?  ? ?Other Visit Diagnoses   ? ? Screening for prostate cancer      ? Relevant Orders  ? Lipid panel  ? Pain in finger of both hands      ? Relevant Orders  ? ANA  ? Rheumatoid factor  ? ?  ? ? ?Return in about 3 months (around 11/05/2021) for DM recheck.  ? ? ?Romilda Garret, NP ? ?

## 2021-08-05 NOTE — Assessment & Plan Note (Addendum)
Patient states has been taking medications as prescribed but was not receptive to Rybelsus from other medications.  This likely been ineffective while taking it.  Did review the proper administration instructions with patient and he will correct what is been doing.  But given A1c still high likely will titrate up to 14 mg Rybelsus next refill.  Patient continue checking glucose with CGM and report if he has consistently above 300 readings.  Continue taking Rybelsus and [place].  Will refer to diabetes educator and nutritionist ?

## 2021-08-05 NOTE — Assessment & Plan Note (Signed)
Continue working on diet and exercise.  Continue taking rosuvastatin.  Will refer to diabetes educator ?

## 2021-08-05 NOTE — Assessment & Plan Note (Signed)
Historical diagnosis, flaring up today pending RF and ANA ?

## 2021-08-05 NOTE — Patient Instructions (Signed)
Need to take the Rybellsus first thing in the morning with a small amount of water with nothing else. Wait 45 mins to eat, drink, or take other medications ?Follow up with me in 3 months, sooner if you need me ?

## 2021-08-06 LAB — COMPREHENSIVE METABOLIC PANEL
ALT: 35 U/L (ref 0–53)
AST: 34 U/L (ref 0–37)
Albumin: 4.5 g/dL (ref 3.5–5.2)
Alkaline Phosphatase: 148 U/L — ABNORMAL HIGH (ref 39–117)
BUN: 13 mg/dL (ref 6–23)
CO2: 22 mEq/L (ref 19–32)
Calcium: 9.3 mg/dL (ref 8.4–10.5)
Chloride: 102 mEq/L (ref 96–112)
Creatinine, Ser: 1.13 mg/dL (ref 0.40–1.50)
GFR: 72.46 mL/min (ref >60.00)
Glucose, Bld: 300 mg/dL — ABNORMAL HIGH (ref 70–99)
Potassium: 3.9 mEq/L (ref 3.5–5.1)
Sodium: 136 mEq/L (ref 135–145)
Total Bilirubin: 0.4 mg/dL (ref 0.2–1.2)
Total Protein: 7.1 g/dL (ref 6.0–8.3)

## 2021-08-06 LAB — LIPID PANEL
Cholesterol: 108 mg/dL (ref 0–200)
HDL: 37.2 mg/dL — ABNORMAL LOW (ref >39.00)
NonHDL: 71
Total CHOL/HDL Ratio: 3
Triglycerides: 279 mg/dL — ABNORMAL HIGH (ref 0.0–149.0)
VLDL: 55.8 mg/dL — ABNORMAL HIGH (ref 0.0–40.0)

## 2021-08-06 LAB — CBC
HCT: 41.2 % (ref 39.0–52.0)
Hemoglobin: 14.2 g/dL (ref 13.0–17.0)
MCHC: 34.5 g/dL (ref 30.0–36.0)
MCV: 87.5 fl (ref 78.0–100.0)
Platelets: 155 10*3/uL (ref 150.0–400.0)
RBC: 4.71 Mil/uL (ref 4.22–5.81)
RDW: 13.3 % (ref 11.5–15.5)
WBC: 5.5 10*3/uL (ref 4.0–10.5)

## 2021-08-06 LAB — PSA: PSA: 0.54 ng/mL (ref 0.10–4.00)

## 2021-08-06 LAB — LDL CHOLESTEROL, DIRECT: Direct LDL: 44 mg/dL

## 2021-08-08 LAB — ANTI-NUCLEAR AB-TITER (ANA TITER): ANA Titer 1: 1:40 {titer} — ABNORMAL HIGH

## 2021-08-08 LAB — ANA: Anti Nuclear Antibody (ANA): POSITIVE — AB

## 2021-08-12 ENCOUNTER — Telehealth: Payer: Self-pay

## 2021-08-12 DIAGNOSIS — E119 Type 2 diabetes mellitus without complications: Secondary | ICD-10-CM

## 2021-08-12 DIAGNOSIS — E1165 Type 2 diabetes mellitus with hyperglycemia: Secondary | ICD-10-CM

## 2021-08-12 NOTE — Telephone Encounter (Signed)
Patient called stating that he does not want to increase Rybelsus to 14 mg and would like to go back on Dapagliflozin-metFORMIN HCl ER (XIGDUO XR)  2.07-998 mg 2 tablets daily.  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 Patient is not sure why he was changed to Rybelsus and would like to go back to what he was on and it was controlling his sugar. Still taking Wilder Glade once daily as directed.

## 2021-08-12 NOTE — Telephone Encounter (Signed)
I cannot see the other office notes. The other office took him off that medication. I assume because he was having a build up of acid in the blood. That can happen. The dapagliflozin it is the same medication as farxiga. We discussed the proper way to take the rhybellus medication as he was taking it with other medications and such.  If you can see the other office notes let me know that way I can review them. If all else fails we can place him on another medication called glipizde that can start bring the sugars down. See if he would like to try that one? It looks like he may have taken that in the past. See if he remembers and see if he tolerates it or not

## 2021-08-13 MED ORDER — RYBELSUS 7 MG PO TABS: 7.0000 mg | ORAL_TABLET | Freq: Every day | ORAL | 2 refills | Status: DC

## 2021-08-13 MED ORDER — GLIPIZIDE 5 MG PO TABS: 5.0000 mg | ORAL_TABLET | Freq: Two times a day (BID) | ORAL | 2 refills | Status: DC

## 2021-08-13 NOTE — Telephone Encounter (Signed)
Patient advised.

## 2021-08-13 NOTE — Telephone Encounter (Signed)
Left message to call back to discuss.

## 2021-08-13 NOTE — Telephone Encounter (Signed)
Patient advised. Patient does not recall exactly what happened with Glipizide and I could not find any notes on the reason for d/c this medication. He does remember one of the medications years ago gave him bowel incontinence. Discussed options of staying on Farxiga and Rebylsus 14 mg or trying Glipizide also. Patient states he is willing to try the Glipizide, would this be in addition to the other 2 medications? What dose of Rebylsus to take then? He just wants to get these sugar readings down ASAP

## 2021-08-13 NOTE — Telephone Encounter (Signed)
Yes continue the farxiga and the rybellus at the '7mg'$ . I sent in glipizide 5 mg to take twice a day this medication works quick. So I would like for him to check his sugar twice a day.  So in summary, Rybellus '7mg'$  daily, Farxgia 10 mg daily, glipizide '5mg'$  BID and checking sugar first thing in the morning and once in the evening and let me know what his reading are running in a week please

## 2021-08-13 NOTE — Addendum Note (Signed)
Addended by: Michela Pitcher on: 08/13/2021 12:32 PM   Modules accepted: Orders

## 2021-08-14 ENCOUNTER — Telehealth: Payer: Self-pay | Admitting: Nurse Practitioner

## 2021-08-14 ENCOUNTER — Other Ambulatory Visit: Payer: Self-pay | Admitting: Nurse Practitioner

## 2021-08-14 DIAGNOSIS — R768 Other specified abnormal immunological findings in serum: Secondary | ICD-10-CM

## 2021-08-14 NOTE — Telephone Encounter (Signed)
-----   Message from [place] sent at 08/13/2021  3:22 PM EDT ----- Patient advised. Patient would like referral for rheumatology to [place] location

## 2021-08-14 NOTE — Telephone Encounter (Signed)
Referral placed for [place] location

## 2021-08-15 ENCOUNTER — Telehealth: Payer: Self-pay | Admitting: Nurse Practitioner

## 2021-08-15 NOTE — Telephone Encounter (Signed)
Patient states he took Glipizide 5 mg 1 tablet yesterday and then checked his sugar later in the day and it was 130. He is not shaking or sweating, not feeling bad. He feels better than he has been, not as sluggish. Patient states lowest sugar reading was 97, then readings have been 120, 130, 108-checking about every hour. He took another Glipizide 5 mg this morning. Patient said earlier today he had a coke and a ham biscuit and sugar did go up to 300 but came down to 120 later on. Patient is aware to try and not eat ham biscuit with coke on a regular. Will get provider's advise and call back. Patient did say access nurse just called him also and spoke with him so there will be a fax from them with this information.

## 2021-08-15 NOTE — Telephone Encounter (Signed)
Pt called to let the nurse know that he received the medication: glipiZIDE (GLUCOTROL) 5 MG tablet, he "stated it has drastically brought his sugar level down to between "100-130". "Pt is a little concerned, I transferred the pt to Access Nurse so he can speak with someone about this." Please return the call when possible.   Callback Number: [phone]

## 2021-08-15 NOTE — Telephone Encounter (Signed)
Please see note below access nurse note from [place]. Sending note to Romilda Garret NP, Anastasiya CMA and will teams Anastasiya.    [facility] - Client TELEPHONE ADVICE RECORD AccessNurse Patient Name: Aaron Guzman Gender: Male DOB: [birth year] Age: 56 Y 11 M 4 D Return Phone Number: [phone] (Primary) Address: City/ State/ Zip: [place] [address] Client [facility] - Client Client Site [place] - Day Provider Romilda Garret- NP Contact Type Call Who Is Calling Patient / Member / Family / Caregiver Call Type Triage / Clinical Relationship To Patient Self Return Phone Number [phone] (Primary) Chief Complaint Blood Sugar Low Reason for Call Symptomatic / Request for Health Information Initial Comment Caller transferring patient caller was placed on glipizide and its dropped his sugar lever from 100-130, Caller ask to please give him 30 minutes before calling back because he wants to get in the shower first. Translation No Nurse Assessment Nurse: Rolin Barry, RN, Levada Dy Date/Time (Eastern Time): 08/15/2021 3:37:31 PM Confirm and document reason for call. If symptomatic, describe symptoms. ---Caller transferring patient caller was placed on glipizide and its dropped his sugar lever from 400-130, Caller ask to please give him 30 minutes before calling back because he wants to get in the shower first. BS is 97 . Does the patient have any new or worsening symptoms? ---Yes Will a triage be completed? ---Yes Related visit to physician within the last 2 weeks? ---Yes Does the PT have any chronic conditions? (i.e. diabetes, asthma, this includes High risk factors for pregnancy, etc.) ---Yes List chronic conditions. ---diabetes , started the Glipizide yesterday. Taking 1 pill q day. 5 mg pill. Is this a behavioral health or substance abuse call? ---No Guidelines Guideline Title  Affirmed Question Affirmed Notes Nurse Date/Time (Eastern Time) Diabetes - Low Blood Sugar Low blood sugar definition and treatment, questions about Rolin Barry, RN, Levada Dy 08/15/2021 3:40:01 PM PLEASE NOTE: All timestamps contained within this report are represented as [place] Standard Time. CONFIDENTIALTY NOTICE: This fax transmission is intended only for the addressee. It contains information that is legally privileged, confidential or otherwise protected from use or disclosure. If you are not the intended recipient, you are strictly prohibited from reviewing, disclosing, copying using or disseminating any of this information or taking any action in reliance on or regarding this information. If you have received this fax in error, please notify us immediately by telephone so that we can arrange for its return to [place]. Phone: [phone], Toll-Free: [phone], Fax: [phone] Page: 2 of 2 Call Id: [id number] [place]. Time Eilene Ghazi Time) Disposition Final User 08/15/2021 3:08:13 PM Attempt made - message left Baltazar Apo 08/15/2021 3:22:42 PM Send To RN Personal Nicki Reaper, RN, Malachy Mood 08/15/2021 3:41:30 PM Home Care Yes Deaton, RN, Cindee Lame Disagree/Comply Comply Caller Understands Yes PreDisposition Did not know what to do Care Advice Given Per Guideline HOME CARE: * You should be able to treat this at home. LOW BLOOD SUGAR (HYPOGLYCEMIA) - DEFINITION: * Low blood sugar (hypoglycemia) is defined as a blood glucose of 70 mg/dL (3.9 mmol/L) or below. * Symptoms of mild hypoglycemia: Shakiness, weakness, not thinking clearly, headache, trembling, sweating, dizziness, palpitations, and hunger. * Symptoms of severe hypoglycemia: Unable to speak, confusion, seizures, and coma. * Contributing factors: Too much insulin, delayed meal, insufficient food, strenuous exercise, and alcohol. DAILY BLOOD GLUCOSE GOALS: CALL BACK IF: * You have more questions * You become worse CARE ADVICE given per  Diabetes -  Low Blood Sugar (Adult) guideline. Comments User: Saverio Danker, RN Date/Time Eilene Ghazi Time): 08/15/2021 3:43:01 PM Caller was calling to report that the '5mg'$  Glipizide pill that he is taking is working. Blood sugar has come down was al little over 130 and now 97, getting ready to eat. Wanted to let the office know

## 2021-08-15 NOTE — Telephone Encounter (Signed)
Glipizide is know to cause a decrease in sugar. It can also cause low blood sugars too. Is he concerned for a low blood sugar? Has he taken the glipizide twice in a day yet? If he is concerned for a low sugar we can discontinue the rybellus. Iet me know what he thinks

## 2021-08-16 NOTE — Telephone Encounter (Signed)
Spoke with patient. He does not have concern with the lower sugar he was happy it got down. Advised on looking out for too low of sugars and what to eat and drink when that happens. Spoke with [facility] verbally about stopping Rybelsus and patient was advised to stop that and continue [place] and Glipizide. Advised to eat good protein and update [place] if sugar readings get drastically worse or have concerns

## 2021-08-16 NOTE — Addendum Note (Signed)
Addended by: Kris Mouton on: 08/16/2021 09:16 AM   Modules accepted: Orders

## 2021-11-05 ENCOUNTER — Ambulatory Visit: Payer: PRIVATE HEALTH INSURANCE | Admitting: Nurse Practitioner

## 2021-11-05 VITALS — BP 124/64 | HR 86 | Temp 96.9°F | Resp 14 | Ht 60.0 in | Wt 201.0 lb

## 2021-11-05 DIAGNOSIS — E782 Mixed hyperlipidemia: Secondary | ICD-10-CM | POA: Diagnosis not present

## 2021-11-05 DIAGNOSIS — Z8261 Family history of arthritis: Secondary | ICD-10-CM | POA: Diagnosis not present

## 2021-11-05 DIAGNOSIS — E1165 Type 2 diabetes mellitus with hyperglycemia: Secondary | ICD-10-CM | POA: Diagnosis not present

## 2021-11-05 DIAGNOSIS — I10 Essential (primary) hypertension: Secondary | ICD-10-CM

## 2021-11-05 DIAGNOSIS — M151 Heberden's nodes (with arthropathy): Secondary | ICD-10-CM | POA: Diagnosis not present

## 2021-11-05 LAB — POCT GLYCOSYLATED HEMOGLOBIN (HGB A1C): Hemoglobin A1C: 8.3 % — AB (ref 4.0–5.6)

## 2021-11-05 NOTE — Assessment & Plan Note (Signed)
Patient currently maintained on glipizide 5 mg twice daily and Farxiga 10 mg daily.  Patient's A1c had a marked reduction from the 11th to the low eights today.  Patient has been on metformin in the past but did cause lactic acidosis present and blood work only.  And that was a dose of 2000 mg a day.

## 2021-11-05 NOTE — Assessment & Plan Note (Signed)
ANA did come back low positive.  He does have an appointment with rheumatology scheduled towards the end of the year.  RF never resulted we will add on toyed factor today.

## 2021-11-05 NOTE — Assessment & Plan Note (Signed)
Historical diagnosis do believe patient is just lisinopril for renal protection.  Blood pressure within normal limits today

## 2021-11-05 NOTE — Patient Instructions (Signed)
Nice to see you today Your A1C came down nicely, keep up the good work. Continue working on the exercise.  I will see you in about 3.5 months for a recheck, follow up sooner if you need me I will be in touch with the labs once I have the results

## 2021-11-05 NOTE — Assessment & Plan Note (Signed)
Patient currently mains on rosuvastatin for HLD and risk reduction.  Continue take medication as prescribed

## 2021-11-05 NOTE — Progress Notes (Signed)
Established Patient Office Visit  Subjective   Patient ID: Aaron Guzman, male    DOB: [birth year]  Age: 56 y.o. MRN: [id number]  Chief Complaint  Patient presents with   Diabetes    Follow up-uses freestyle libre device to check his sugar. Readings up and down mainly over 200 and sometimes 120ish    Diabetes Pertinent negatives for diabetes include no chest pain.     DM2: Has a free libre. 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 He was doing 3 miles 3 times a week.  Currently maintained on glipizide 5 mg twice daily along with Farxiga 10 mg daily.  HTN: Patient currently on lisinopril more for renal protection versus hypertension.  Blood pressure within normal limits today.   HLD: On rosuvastatin for risk reduction.  Patient's been over the last modifications.  He has been walking up to 3 miles a day about 3 times a week.  Has slacked off as of late  Arthritis: has been years. It is in the DIP joints of bilateral hands. Thinks mother and sister have rheumatoid arthritis.  We did check an ANA last time that was slightly positive RF factor did not result.  Did a picture of the right hand and is on file.    Review of Systems  Constitutional:  Negative for chills and fever.  Respiratory:  Negative for shortness of breath.   Cardiovascular:  Negative for chest pain and leg swelling.  Musculoskeletal:  Positive for joint pain.      Objective:     BP 124/64   Pulse 86   Temp (!) 96.9 F (36.1 C) (Temporal)   Resp 14   Ht 5' (1.524 m)   Wt 201 lb (91.2 kg)   SpO2 96%   BMI 39.26 kg/m  BP Readings from Last 3 Encounters:  11/05/21 124/64  08/05/21 112/70  05/07/21 124/82   Wt Readings from Last 3 Encounters:  11/05/21 201 lb (91.2 kg)  08/05/21 198 lb 6 oz (90 kg)  05/07/21 197 lb 5 oz (89.5 kg)      Physical Exam Vitals and nursing note reviewed.  Constitutional:      Appearance: Normal appearance.  Cardiovascular:      Rate and Rhythm: Normal rate and regular rhythm.     Pulses:          Dorsalis pedis pulses are 2+ on the right side and 2+ on the left side.     Heart sounds: Normal heart sounds.  Pulmonary:     Effort: Pulmonary effort is normal.     Breath sounds: Normal breath sounds.  Abdominal:     General: Bowel sounds are normal.  Musculoskeletal:     Right lower leg: No edema.     Left lower leg: No edema.     Comments: [place] nodules present DIP joints of bilateral hands.  Feet:     Right foot:     Skin integrity: Skin integrity normal.     Left foot:     Skin integrity: Skin integrity normal.  Neurological:     Mental Status: He is alert.      Results for orders placed or performed in visit on 11/05/21  POCT glycosylated hemoglobin (Hb A1C)  Result Value Ref Range   Hemoglobin A1C 8.3 (A) 4.0 - 5.6 %   HbA1c POC (<> result, manual entry)     HbA1c, POC (prediabetic range)     HbA1c,  POC (controlled diabetic range)        The ASCVD Risk score (Arnett DK, et al., 2019) failed to calculate for the following reasons:   The valid total cholesterol range is 130 to 320 mg/dL    Assessment & Plan:   Problem List Items Addressed This Visit       Cardiovascular and Mediastinum   Hypertension    Historical diagnosis do believe patient is just lisinopril for renal protection.  Blood pressure within normal limits today        Endocrine   Type 2 diabetes mellitus ([place]) - Primary    Patient currently maintained on glipizide 5 mg twice daily and Farxiga 10 mg daily.  Patient's A1c had a marked reduction from the 11th to the low eights today.  Patient has been on metformin in the past but did cause lactic acidosis present and blood work only.  And that was a dose of 2000 mg a day.      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  He does have an appointment with rheumatology scheduled towards the end of the year.  RF never resulted we will add on toyed factor today.        Other   Hyperlipidemia    Patient currently mains on rosuvastatin for HLD and risk reduction.  Continue take medication as prescribed      Other Visit Diagnoses     Family history of rheumatoid arthritis       Relevant Orders   Rheumatoid factor       Return in about 14 weeks (around 02/11/2022) for DM recheck.    Romilda Garret, NP

## 2021-11-06 LAB — COMPREHENSIVE METABOLIC PANEL
AG Ratio: 1.6 (calc) (ref 1.0–2.5)
ALT: 19 U/L (ref 9–46)
AST: 24 U/L (ref 10–35)
Albumin: 4.4 g/dL (ref 3.6–5.1)
Alkaline phosphatase (APISO): 118 U/L (ref 35–144)
BUN: 10 mg/dL (ref 7–25)
CO2: 21 mmol/L (ref 20–32)
Calcium: 9.5 mg/dL (ref 8.6–10.3)
Chloride: 109 mmol/L (ref 98–110)
Creat: 1.19 mg/dL (ref 0.70–1.30)
Globulin: 2.7 g/dL (calc) (ref 1.9–3.7)
Glucose, Bld: 173 mg/dL — ABNORMAL HIGH (ref 65–99)
Potassium: 3.9 mmol/L (ref 3.5–5.3)
Sodium: 141 mmol/L (ref 135–146)
Total Bilirubin: 0.5 mg/dL (ref 0.2–1.2)
Total Protein: 7.1 g/dL (ref 6.1–8.1)

## 2021-11-06 LAB — CBC
HCT: 43.3 % (ref 38.5–50.0)
Hemoglobin: 14.9 g/dL (ref 13.2–17.1)
MCH: 30.1 pg (ref 27.0–33.0)
MCHC: 34.4 g/dL (ref 32.0–36.0)
MCV: 87.5 fL (ref 80.0–100.0)
MPV: 11.5 fL (ref 7.5–12.5)
Platelets: 167 10*3/uL (ref 140–400)
RBC: 4.95 10*6/uL (ref 4.20–5.80)
RDW: 13.6 % (ref 11.0–15.0)
WBC: 5.1 10*3/uL (ref 3.8–10.8)

## 2021-11-06 LAB — RHEUMATOID FACTOR: Rheumatoid fact SerPl-aCnc: <14 IU/mL (ref <14)

## 2021-12-04 ENCOUNTER — Other Ambulatory Visit: Payer: Self-pay | Admitting: Nurse Practitioner

## 2021-12-04 DIAGNOSIS — E1165 Type 2 diabetes mellitus with hyperglycemia: Secondary | ICD-10-CM

## 2021-12-04 MED ORDER — GLIPIZIDE 5 MG PO TABS: 5.0000 mg | ORAL_TABLET | Freq: Two times a day (BID) | ORAL | 1 refills | Status: DC

## 2021-12-04 NOTE — Telephone Encounter (Signed)
Patient advised. Refill provided

## 2021-12-04 NOTE — Telephone Encounter (Signed)
  Encourage patient to contact the pharmacy for refills or they can request refills through [facility]  Did the patient contact the pharmacy:  yes   LAST APPOINTMENT DATE:  Please schedule appointment if longer than 1 year  NEXT APPOINTMENT DATE:02/11/2022  MEDICATION:glipiZIDE (GLUCOTROL) 5 MG tablet  Is the patient out of medication? yes  If not, how much is left?  Is this a 90 day supply: yes  PHARMACY: [facility] ([address]), [place] Phone:  [phone]  Fax:  [phone]      Let patient know to contact pharmacy at the end of the day to make sure medication is ready.  Please notify patient to allow 48-72 hours to process

## 2022-01-11 IMAGING — CR DG HAND COMPLETE 3+V*R*
3 series · 3 of 3 positions shown · non-contrast
Comparison: None.

CLINICAL DATA: Pain swelling

EXAM:
RIGHT HAND - COMPLETE 3+ VIEW

[hand pa]
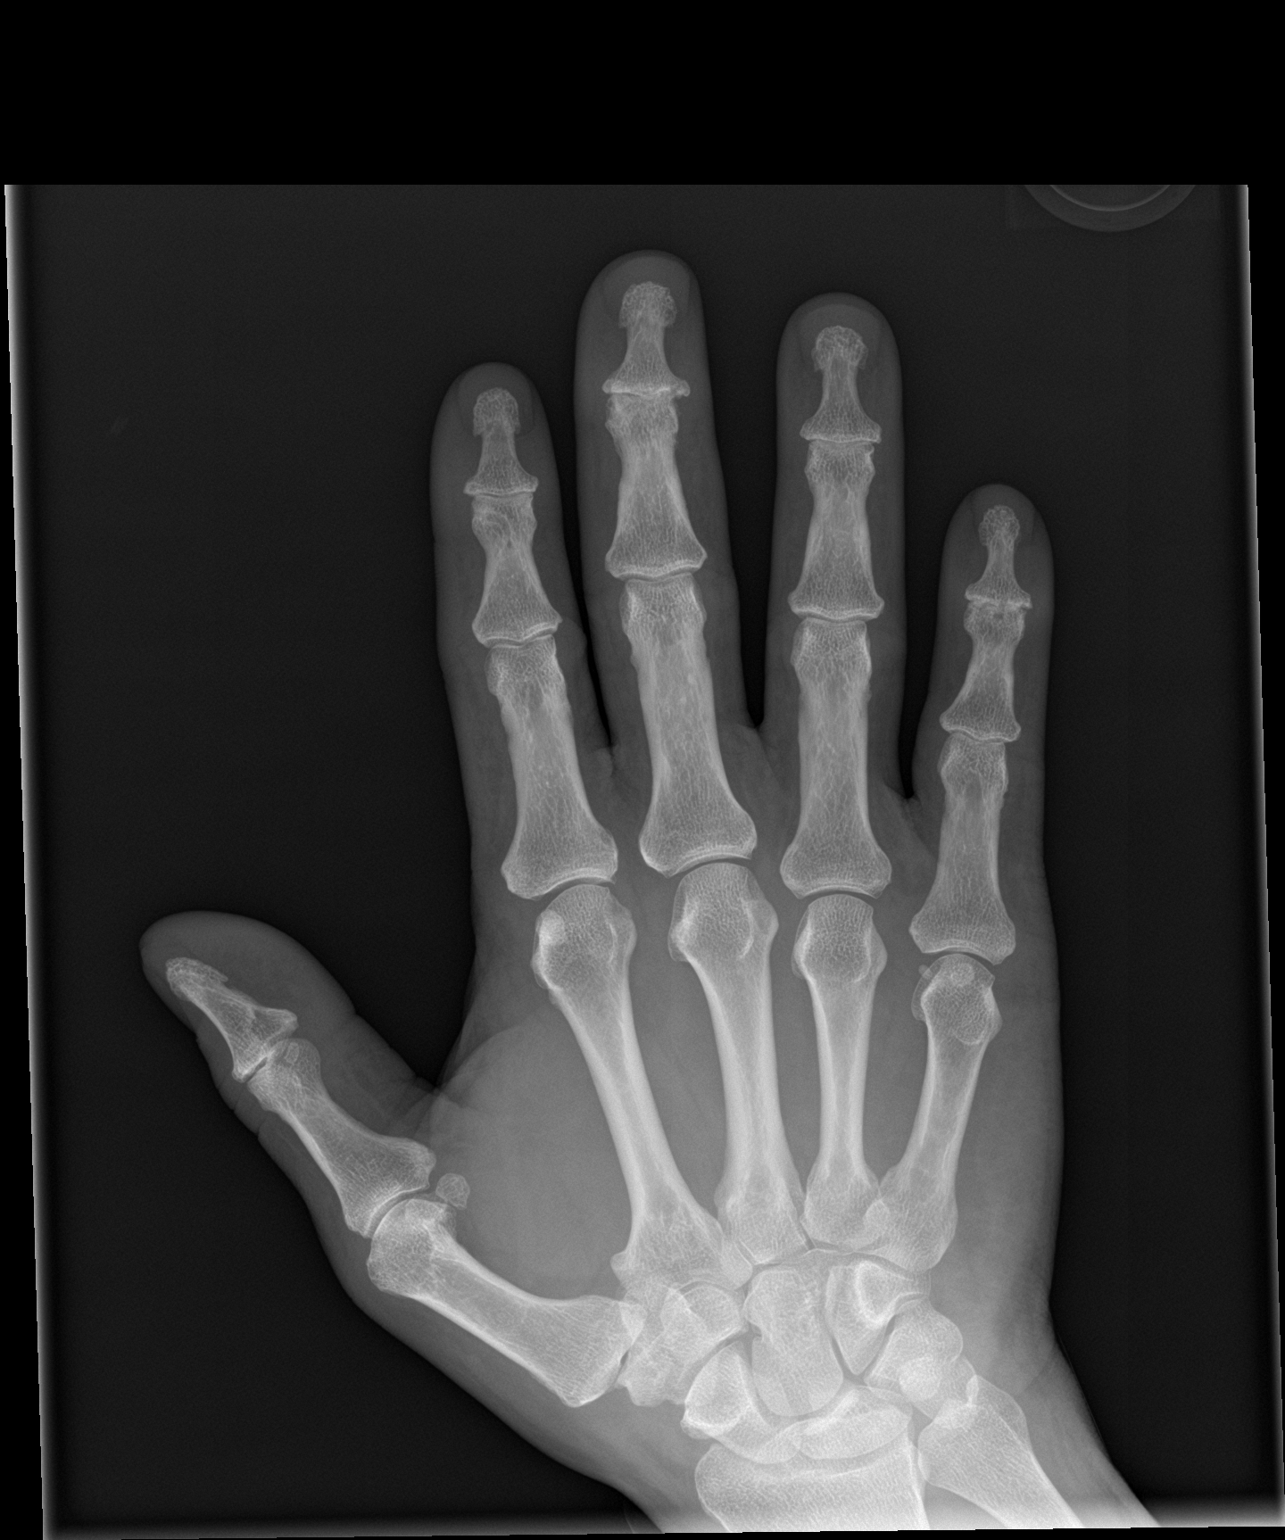

[hand obl]
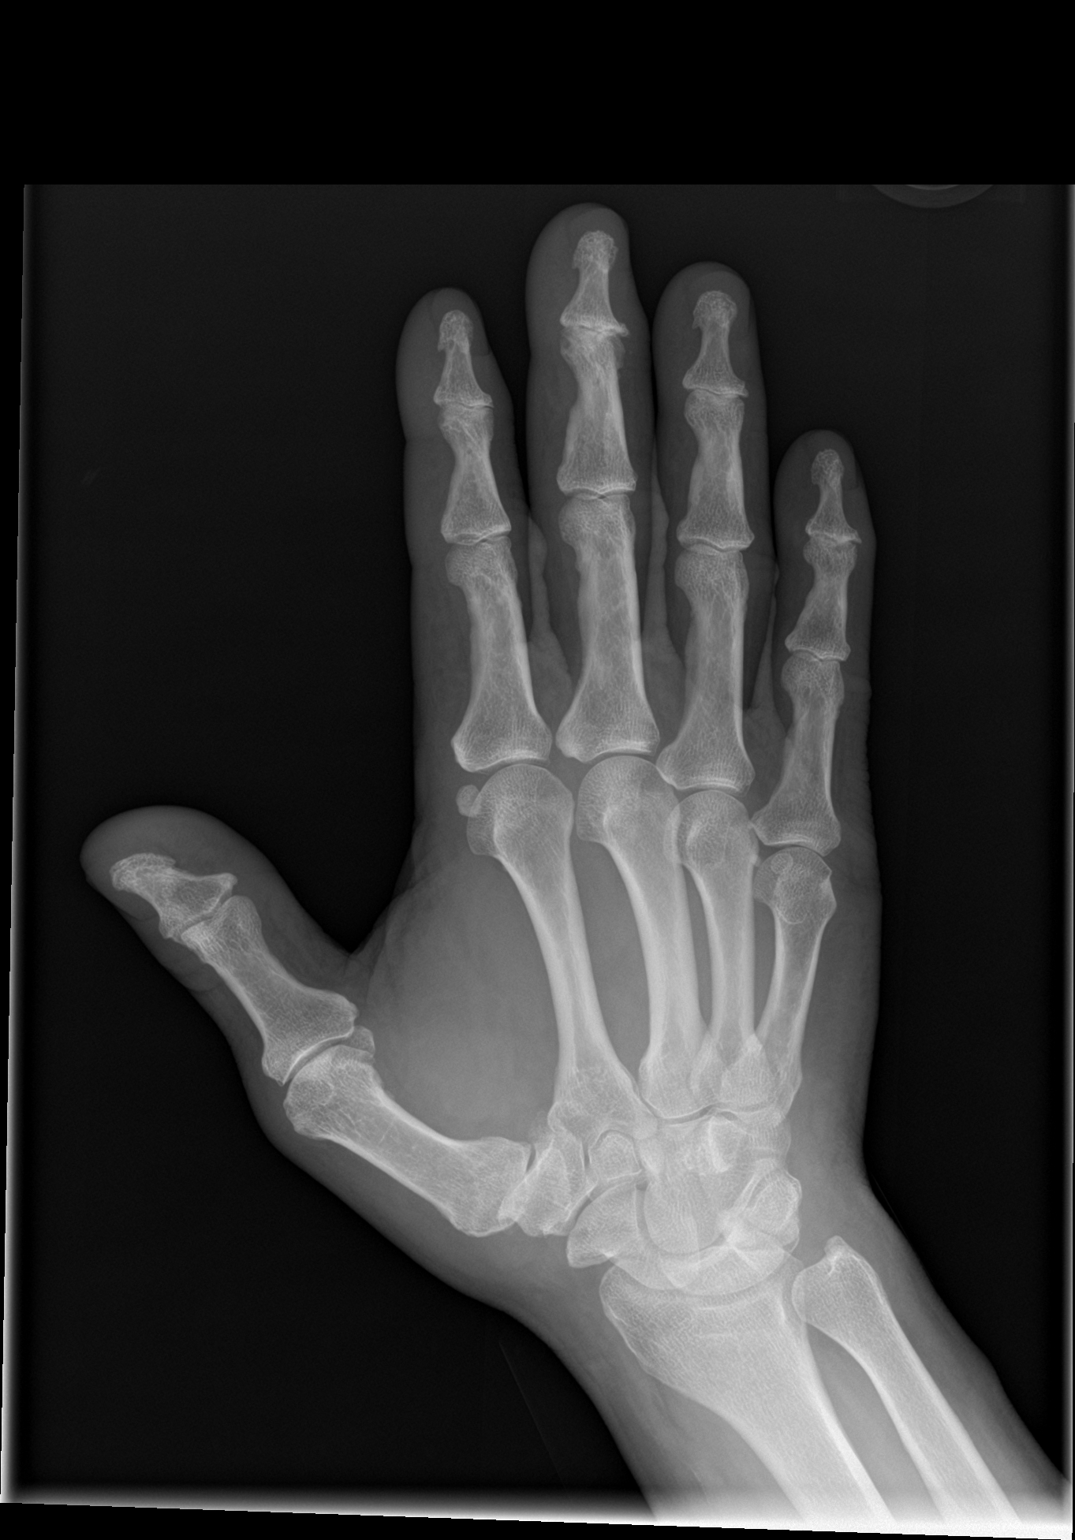

[hand lat]
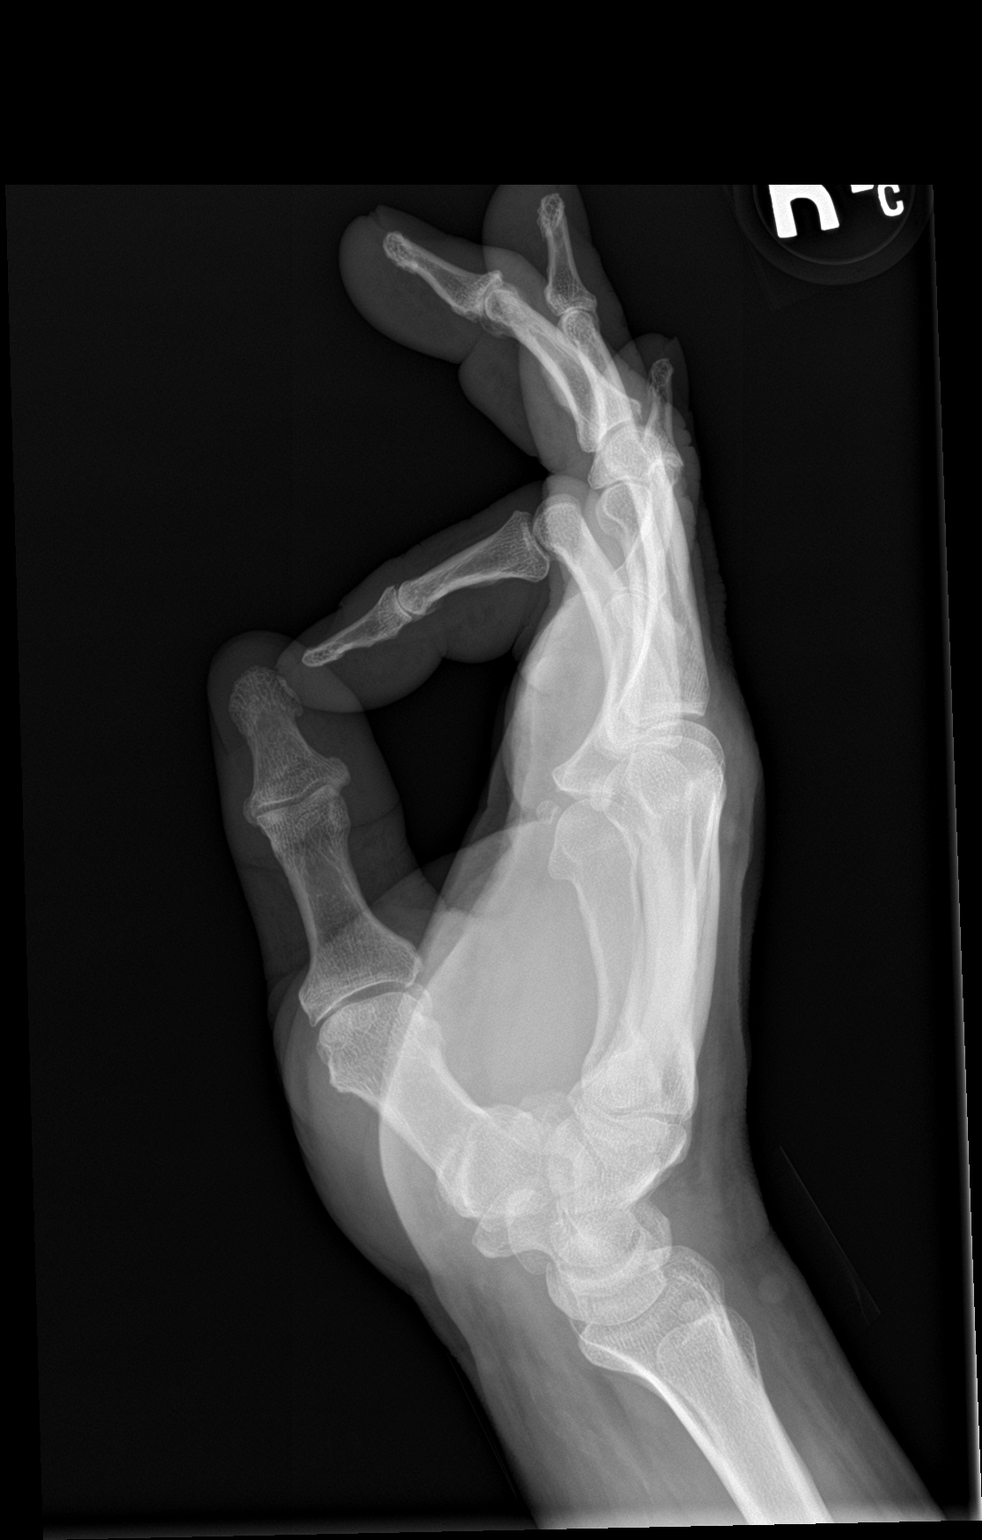

[3 of 3 positions shown; findings below may reference images not displayed]

FINDINGS: There are moderate to severe degenerative changes of the
interphalangeal joints most evident at the third and fifth distal
interphalangeal joints. There is no dislocation. No fracture. There
are no erosions. There is no radiopaque foreign body.
IMPRESSION: Osteoarthritis as detailed above, greatest within the third and
fifth distal interphalangeal joints.

## 2022-02-11 ENCOUNTER — Ambulatory Visit: Payer: PRIVATE HEALTH INSURANCE | Admitting: Nurse Practitioner

## 2022-02-19 NOTE — Progress Notes (Deleted)
Office Visit Note  Patient: Aaron Guzman             Date of Birth: [birth year]           MRN: [id number]             PCP: Michela Pitcher, NP Referring: Michela Pitcher, NP Visit Date: 03/04/2022 Occupation: _0 @  Subjective:  No chief complaint on file.   History of Present Illness: Aaron Guzman is a 56 y.o. male ***   Activities of Daily Living:  Patient reports morning stiffness for *** {minute/hour:19697}.   Patient {ACTIONS;DENIES/REPORTS:21021675::"Denies"} nocturnal pain.  Difficulty dressing/grooming: {ACTIONS;DENIES/REPORTS:21021675::"Denies"} Difficulty climbing stairs: {ACTIONS;DENIES/REPORTS:21021675::"Denies"} Difficulty getting out of chair: {ACTIONS;DENIES/REPORTS:21021675::"Denies"} Difficulty using hands for taps, buttons, cutlery, and/or writing: {ACTIONS;DENIES/REPORTS:21021675::"Denies"}  No Rheumatology ROS completed.   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 02/23/2020   Arthritis of left acromioclavicular joint 02/23/2020   Type 2 diabetes mellitus ([place]) 11/24/2019    Past Medical History:  Diagnosis Date   Arthritis    fingers   Diabetes mellitus without complication ([place])    on meds   Hyperlipidemia    on meds   Hypertension    on meds   Low testosterone     Family History  Problem Relation Age of Onset   Diabetes Mother    Breast cancer Mother    Cancer Mother        breast   Diabetes Father    Diabetes Sister    Diabetes Sister    Diabetes Maternal Uncle    Cancer Maternal Uncle        not sure of the type-it was stage 4   Diabetes Paternal Grandmother    Diabetes Paternal Grandfather    Colon polyps Neg Hx    Colon cancer Neg Hx    Esophageal cancer Neg Hx    Rectal cancer Neg Hx    Stomach cancer Neg Hx    Past Surgical  History:  Procedure Laterality Date   COLONOSCOPY  2000   FINGER SURGERY Left 1991   INGUINAL HERNIA REPAIR Left 1990   WISDOM TOOTH EXTRACTION  1987   Social History   Social History Narrative   Daughter 81 yrs.   Truck driving full time   Immunization History  Administered Date(s) Administered   PFIZER(Purple Top)SARS-COV-2 Vaccination 10/23/2020     Objective: Vital Signs: There were no vitals taken for this visit.   Physical Exam   Musculoskeletal Exam: ***  CDAI Exam: CDAI Score: -- Patient Global: --; Provider Global: -- Swollen: --; Tender: -- Joint Exam 03/04/2022   No joint exam has been documented for this visit   There is currently no information documented on the homunculus. Go to the Rheumatology activity and complete the homunculus joint exam.  Investigation: No additional findings.  Imaging: No results found.  Recent Labs: Lab Results  Component Value Date   WBC 5.1 11/05/2021   HGB 14.9 11/05/2021   PLT 167 11/05/2021   NA 141 11/05/2021   K 3.9 11/05/2021   CL 109 11/05/2021   CO2 21 11/05/2021   GLUCOSE 173 (H) 11/05/2021   BUN 10 11/05/2021   CREATININE 1.19 11/05/2021   BILITOT 0.5 11/05/2021   ALKPHOS 148 (H) 08/05/2021   AST 24 11/05/2021   ALT 19 11/05/2021   PROT  7.1 11/05/2021   ALBUMIN 4.5 08/05/2021   CALCIUM 9.5 11/05/2021   GFRAA 102 05/04/2020    Speciality Comments: No specialty comments available.  Procedures:  No procedures performed Allergies: Trulicity [dulaglutide]   Assessment / Plan:     Visit Diagnoses: Positive ANA (antinuclear antibody) - 08/05/21: ANA 1:40NS. 11/05/21: RF<14. 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  No orders of the defined types were placed in this encounter.   Face-to-face time spent with patient was *** minutes. Greater than 50% of time was spent in counseling and coordination of care.  Follow-Up Instructions: No follow-ups on file.   Ofilia Neas, PA-C  Note - This record has been created using Dragon software.  Chart creation errors have been sought, but may not always  have been located. Such creation errors do not reflect on  the standard of medical care.

## 2022-03-04 ENCOUNTER — Ambulatory Visit: Payer: PRIVATE HEALTH INSURANCE | Admitting: Rheumatology

## 2022-03-04 DIAGNOSIS — E782 Mixed hyperlipidemia: Secondary | ICD-10-CM

## 2022-03-04 DIAGNOSIS — M151 Heberden's nodes (with arthropathy): Secondary | ICD-10-CM

## 2022-03-04 DIAGNOSIS — R768 Other specified abnormal immunological findings in serum: Secondary | ICD-10-CM

## 2022-03-04 DIAGNOSIS — E1165 Type 2 diabetes mellitus with hyperglycemia: Secondary | ICD-10-CM

## 2022-03-04 DIAGNOSIS — M19012 Primary osteoarthritis, left shoulder: Secondary | ICD-10-CM

## 2022-03-04 DIAGNOSIS — I10 Essential (primary) hypertension: Secondary | ICD-10-CM

## 2022-05-18 ENCOUNTER — Other Ambulatory Visit: Payer: Self-pay | Admitting: Nurse Practitioner

## 2022-05-18 DIAGNOSIS — E119 Type 2 diabetes mellitus without complications: Secondary | ICD-10-CM

## 2022-05-24 ENCOUNTER — Other Ambulatory Visit: Payer: Self-pay | Admitting: Nurse Practitioner

## 2022-05-24 DIAGNOSIS — E782 Mixed hyperlipidemia: Secondary | ICD-10-CM

## 2022-05-24 DIAGNOSIS — E119 Type 2 diabetes mellitus without complications: Secondary | ICD-10-CM

## 2022-06-13 ENCOUNTER — Ambulatory Visit: Payer: PRIVATE HEALTH INSURANCE | Admitting: Nurse Practitioner

## 2022-06-23 ENCOUNTER — Other Ambulatory Visit: Payer: Self-pay | Admitting: Nurse Practitioner

## 2022-06-23 DIAGNOSIS — E782 Mixed hyperlipidemia: Secondary | ICD-10-CM

## 2022-06-23 DIAGNOSIS — E119 Type 2 diabetes mellitus without complications: Secondary | ICD-10-CM

## 2022-06-30 ENCOUNTER — Encounter: Payer: Self-pay | Admitting: Nurse Practitioner

## 2022-06-30 ENCOUNTER — Ambulatory Visit: Payer: PRIVATE HEALTH INSURANCE | Admitting: Nurse Practitioner

## 2022-06-30 VITALS — BP 116/68 | HR 95 | Temp 97.7°F | Resp 16 | Ht 60.0 in | Wt 200.4 lb

## 2022-06-30 DIAGNOSIS — E1165 Type 2 diabetes mellitus with hyperglycemia: Secondary | ICD-10-CM

## 2022-06-30 LAB — POCT GLYCOSYLATED HEMOGLOBIN (HGB A1C): Hemoglobin A1C: 9.9 % — AB (ref 4.0–5.6)

## 2022-06-30 MED ORDER — GLIPIZIDE 10 MG PO TABS: 10.0000 mg | ORAL_TABLET | Freq: Two times a day (BID) | ORAL | 3 refills | Status: DC

## 2022-06-30 NOTE — Patient Instructions (Signed)
Nice to see you today I have increased your glipizide to 10mg  twice a day Follow up with me in 3 month for a recheck, sooner if you need me

## 2022-06-30 NOTE — Progress Notes (Signed)
Established Patient Office Visit  Subjective   Patient ID: Aaron Guzman, male    DOB: February 26, 1966  Age: 57 y.o. MRN: [id number]  Chief Complaint  Patient presents with   Diabetes      DM2: has free stylel libre. Currently on glipizide and farxiga States that he has the CGm. States that it has been up and down.  States no low glucose below 80. States that it is 130 and the average has been 205. States that the highest is 400.  States that he does walk around after he drops his load. Sttes that itdepends on the area.  Eye appt: states that he changed insurance and is working on getting an eye appointment set up.   Rheum: Patient states that he went to the rheumatologist office but was there at the wrong date and time and missed his appointment.  States that they can get him back in June but he is not sure if he made an appointment he thought he had to go through me.  Did tell patient to call rheumatology office to set up an appointment that referral should be good   Review of Systems  Constitutional:  Negative for chills and fever.  Respiratory:  Negative for shortness of breath.   Cardiovascular:  Negative for chest pain.  Neurological:  Negative for headaches.      Objective:     BP 116/68   Pulse 95   Temp 97.7 F (36.5 C)   Resp 16   Ht 5' (1.524 m)   Wt 200 lb 6 oz (90.9 kg)   SpO2 98%   BMI 39.13 kg/m  BP Readings from Last 3 Encounters:  06/30/22 116/68  11/05/21 124/64  08/05/21 112/70   Wt Readings from Last 3 Encounters:  06/30/22 200 lb 6 oz (90.9 kg)  11/05/21 201 lb (91.2 kg)  08/05/21 198 lb 6 oz (90 kg)      Physical Exam Vitals and nursing note reviewed.  Constitutional:      Appearance: Normal appearance.  Cardiovascular:     Rate and Rhythm: Normal rate and regular rhythm.     Pulses:          Dorsalis pedis pulses are 2+ on the right side and 2+ on the left side.     Heart sounds: Normal heart sounds.  Pulmonary:     Effort:  Pulmonary effort is normal.     Breath sounds: Normal breath sounds.  Abdominal:     General: Bowel sounds are normal.  Musculoskeletal:     Right lower leg: No edema.     Left lower leg: No edema.  Feet:     Right foot:     Skin integrity: Skin integrity normal.     Left foot:     Skin integrity: Skin integrity normal.  Neurological:     Mental Status: He is alert.      Results for orders placed or performed in visit on 06/30/22  POCT glycosylated hemoglobin (Hb A1C)  Result Value Ref Range   Hemoglobin A1C 9.9 (A) 4.0 - 5.6 %   HbA1c POC (<> result, manual entry)     HbA1c, POC (prediabetic range)     HbA1c, POC (controlled diabetic range)        The ASCVD Risk score (Arnett DK, et al., 2019) failed to calculate for the following reasons:   The valid total cholesterol range is 130 to 320 mg/dL    Assessment & Plan:  Problem List Items Addressed This Visit       Endocrine   Type 2 diabetes mellitus - Primary    Patient's A1c went up to 9.9.  Will increase glipizide from 5 mg twice daily to 10 mg twice daily.  Continue Farxiga 10.  Continue working lifestyle applications.  Did review patient certain foods and how they affect glycemic index that protein and vegetables are lower glycemic index foods cookies candies and carbs are higher.  Continue using CGM to check glucose follow-up 3 months      Relevant Medications   glipiZIDE (GLUCOTROL) 10 MG tablet   Other Relevant Orders   POCT glycosylated hemoglobin (Hb A1C) (Completed)   CBC   Basic metabolic panel    Return in about 3 months (around 09/29/2022) for DM recheck.    Audria Nine, NP

## 2022-06-30 NOTE — Assessment & Plan Note (Signed)
Patient's A1c went up to 9.9.  Will increase glipizide from 5 mg twice daily to 10 mg twice daily.  Continue Farxiga 10.  Continue working lifestyle applications.  Did review patient certain foods and how they affect glycemic index that protein and vegetables are lower glycemic index foods cookies candies and carbs are higher.  Continue using CGM to check glucose follow-up 3 months

## 2022-07-01 LAB — BASIC METABOLIC PANEL
BUN: 13 mg/dL (ref 6–23)
CO2: 20 mEq/L (ref 19–32)
Calcium: 9.3 mg/dL (ref 8.4–10.5)
Chloride: 105 mEq/L (ref 96–112)
Creatinine, Ser: 1.04 mg/dL (ref 0.40–1.50)
GFR: 79.54 mL/min (ref >60.00)
Glucose, Bld: 175 mg/dL — ABNORMAL HIGH (ref 70–99)
Potassium: 3.7 mEq/L (ref 3.5–5.1)
Sodium: 136 mEq/L (ref 135–145)

## 2022-07-01 LAB — CBC
HCT: 45.3 % (ref 39.0–52.0)
Hemoglobin: 15.4 g/dL (ref 13.0–17.0)
MCHC: 33.9 g/dL (ref 30.0–36.0)
MCV: 87.3 fl (ref 78.0–100.0)
Platelets: 181 10*3/uL (ref 150.0–400.0)
RBC: 5.18 Mil/uL (ref 4.22–5.81)
RDW: 14.1 % (ref 11.5–15.5)
WBC: 7.8 10*3/uL (ref 4.0–10.5)

## 2022-07-30 ENCOUNTER — Other Ambulatory Visit: Payer: Self-pay | Admitting: Nurse Practitioner

## 2022-07-30 DIAGNOSIS — E119 Type 2 diabetes mellitus without complications: Secondary | ICD-10-CM

## 2022-08-24 ENCOUNTER — Other Ambulatory Visit: Payer: Self-pay | Admitting: Nurse Practitioner

## 2022-08-24 DIAGNOSIS — E119 Type 2 diabetes mellitus without complications: Secondary | ICD-10-CM

## 2022-10-14 ENCOUNTER — Ambulatory Visit: Payer: PRIVATE HEALTH INSURANCE | Admitting: Nurse Practitioner

## 2022-10-22 ENCOUNTER — Encounter (INDEPENDENT_AMBULATORY_CARE_PROVIDER_SITE_OTHER): Payer: Self-pay

## 2022-11-13 LAB — HM DIABETES EYE EXAM

## 2022-11-14 ENCOUNTER — Encounter: Payer: Self-pay | Admitting: Nurse Practitioner

## 2022-11-14 ENCOUNTER — Ambulatory Visit: Payer: PRIVATE HEALTH INSURANCE | Admitting: Nurse Practitioner

## 2022-11-14 VITALS — BP 118/72 | HR 83 | Temp 97.7°F | Ht 60.0 in | Wt 194.2 lb

## 2022-11-14 DIAGNOSIS — Z7984 Long term (current) use of oral hypoglycemic drugs: Secondary | ICD-10-CM | POA: Diagnosis not present

## 2022-11-14 DIAGNOSIS — E1165 Type 2 diabetes mellitus with hyperglycemia: Secondary | ICD-10-CM | POA: Diagnosis not present

## 2022-11-14 DIAGNOSIS — E782 Mixed hyperlipidemia: Secondary | ICD-10-CM | POA: Diagnosis not present

## 2022-11-14 LAB — POCT GLYCOSYLATED HEMOGLOBIN (HGB A1C): Hemoglobin A1C: 8.8 % — AB (ref 4.0–5.6)

## 2022-11-14 MED ORDER — ROSUVASTATIN CALCIUM 20 MG PO TABS
20.0000 mg | ORAL_TABLET | Freq: Every day | ORAL | 1 refills | Status: DC
Start: 2022-11-14 — End: 2023-05-26

## 2022-11-14 MED ORDER — DAPAGLIFLOZIN PROPANEDIOL 10 MG PO TABS
10.0000 mg | ORAL_TABLET | Freq: Every day | ORAL | 1 refills | Status: DC
Start: 2022-11-14 — End: 2023-05-26

## 2022-11-14 MED ORDER — METFORMIN HCL ER 500 MG PO TB24
500.0000 mg | ORAL_TABLET | Freq: Two times a day (BID) | ORAL | 0 refills | Status: AC
Start: 2022-11-14 — End: ?

## 2022-11-14 MED ORDER — LISINOPRIL 2.5 MG PO TABS
2.5000 mg | ORAL_TABLET | Freq: Every day | ORAL | 1 refills | Status: DC
Start: 2022-11-14 — End: 2023-05-26

## 2022-11-14 MED ORDER — GLIPIZIDE 10 MG PO TABS
10.0000 mg | ORAL_TABLET | Freq: Two times a day (BID) | ORAL | 1 refills | Status: AC
Start: 2022-11-14 — End: ?

## 2022-11-14 NOTE — Patient Instructions (Signed)
Nice to see you today I have sent in your medications to the pharmacy Follow up with me in 3 months, sooner if you need me

## 2022-11-14 NOTE — Progress Notes (Signed)
Established Patient Office Visit  Subjective   Patient ID: Aaron Guzman, male    DOB: 1965/07/16  Age: 57 y.o. MRN: [id number]  Chief Complaint  Patient presents with   Diabetes    Pt complains of hand hurting when trying to grip. Diabetic eye exam done yesterday. Results from office Will be faxed.    Medication Refill    Pt states needing refill on Farxiga, glipizide, lisinopril, and crestor       DM2: patient is currently on glipizide, farxiga and has a CGM last A1C was 9.9 and today is 8.8  State that he is still doin gthe CGM. States that he is having be careful with what he ate. States that a small swig of soda will shot his sugar up. States that he did cut sodas out and has been walking some. States that he will do an ocassional fruit punch  States that they have been in the green most of the time.   States that he has tried metformin in the past and caused gi upset. They retrailed it and had some acidosis per patient report was taken off.  States that it did good control for his sugars and he felt fine.   Review of Systems  Constitutional:  Negative for chills and fever.  Respiratory:  Negative for shortness of breath.   Cardiovascular:  Negative for chest pain.  Gastrointestinal:  Negative for abdominal pain, constipation, diarrhea, nausea and vomiting.       BM daily   Neurological:  Negative for headaches.  Psychiatric/Behavioral:  Negative for hallucinations and suicidal ideas.       Objective:     BP 118/72   Pulse 83   Temp 97.7 F (36.5 C) (Temporal)   Ht 5' (1.524 m)   Wt 194 lb 3.2 oz (88.1 kg)   SpO2 95%   BMI 37.93 kg/m  BP Readings from Last 3 Encounters:  11/14/22 118/72  06/30/22 116/68  11/05/21 124/64   Wt Readings from Last 3 Encounters:  11/14/22 194 lb 3.2 oz (88.1 kg)  06/30/22 200 lb 6 oz (90.9 kg)  11/05/21 201 lb (91.2 kg)   SpO2 Readings from Last 3 Encounters:  11/14/22 95%  06/30/22 98%  11/05/21 96%      Physical  Exam Vitals and nursing note reviewed.  Constitutional:      Appearance: Normal appearance.  Cardiovascular:     Rate and Rhythm: Normal rate and regular rhythm.     Pulses:          Dorsalis pedis pulses are 2+ on the right side and 2+ on the left side.       Posterior tibial pulses are 2+ on the right side and 2+ on the left side.     Heart sounds: Normal heart sounds.  Pulmonary:     Effort: Pulmonary effort is normal.     Breath sounds: Normal breath sounds.  Abdominal:     General: Bowel sounds are normal.  Musculoskeletal:     Right lower leg: No edema.     Left lower leg: No edema.  Feet:     Right foot:     Skin integrity: Skin integrity normal.     Toenail Condition: Right toenails are normal.     Left foot:     Skin integrity: Skin integrity normal.     Toenail Condition: Left toenails are normal.  Neurological:     Mental Status: He is alert.  Results for orders placed or performed in visit on 11/14/22  HM DIABETES EYE EXAM  Result Value Ref Range   HM Diabetic Eye Exam No Retinopathy No Retinopathy  Results for orders placed or performed in visit on 11/14/22  POCT glycosylated hemoglobin (Hb A1C)  Result Value Ref Range   Hemoglobin A1C 8.8 (A) 4.0 - 5.6 %   HbA1c POC (<> result, manual entry)     HbA1c, POC (prediabetic range)     HbA1c, POC (controlled diabetic range)        The ASCVD Risk score (Arnett DK, et al., 2019) failed to calculate for the following reasons:   The valid total cholesterol range is 130 to 320 mg/dL    Assessment & Plan:   Problem List Items Addressed This Visit       Endocrine   Type 2 diabetes mellitus (HCC) - Primary    Patient currently maintained on glipizide, Farxiga and A1c still not at goal we will add on metformin XR 500 mg twice daily.  Patient has a CGM continue monitoring glucose continue working on his lifestyle modifications inclusive of weight loss, exercise, dietary modifications.      Relevant  Medications   metFORMIN (GLUCOPHAGE-XR) 500 MG 24 hr tablet   dapagliflozin propanediol (FARXIGA) 10 MG TABS tablet   glipiZIDE (GLUCOTROL) 10 MG tablet   lisinopril (ZESTRIL) 2.5 MG tablet   rosuvastatin (CRESTOR) 20 MG tablet   Other Relevant Orders   POCT glycosylated hemoglobin (Hb A1C) (Completed)     Other   Hyperlipidemia    Refill provided for cholesterol medication today.      Relevant Medications   lisinopril (ZESTRIL) 2.5 MG tablet   rosuvastatin (CRESTOR) 20 MG tablet    Return in about 3 months (around 02/14/2023) for DM recheck.    Audria Nine, NP

## 2022-11-14 NOTE — Assessment & Plan Note (Signed)
Refill provided for cholesterol medication today.

## 2022-11-14 NOTE — Assessment & Plan Note (Signed)
Patient currently maintained on glipizide, Farxiga and A1c still not at goal we will add on metformin XR 500 mg twice daily.  Patient has a CGM continue monitoring glucose continue working on his lifestyle modifications inclusive of weight loss, exercise, dietary modifications.

## 2023-02-18 ENCOUNTER — Ambulatory Visit: Payer: PRIVATE HEALTH INSURANCE | Admitting: Nurse Practitioner

## 2023-02-27 ENCOUNTER — Other Ambulatory Visit: Payer: Self-pay | Admitting: Nurse Practitioner

## 2023-02-27 DIAGNOSIS — E119 Type 2 diabetes mellitus without complications: Secondary | ICD-10-CM

## 2023-05-23 ENCOUNTER — Other Ambulatory Visit: Payer: Self-pay | Admitting: Nurse Practitioner

## 2023-05-23 DIAGNOSIS — E1165 Type 2 diabetes mellitus with hyperglycemia: Secondary | ICD-10-CM

## 2023-05-23 DIAGNOSIS — E782 Mixed hyperlipidemia: Secondary | ICD-10-CM

## 2023-05-26 NOTE — Telephone Encounter (Signed)
 Needs a follow up in 30 days for DM to continue getting refills

## 2023-05-27 NOTE — Telephone Encounter (Signed)
 Lvmtcb, sent mychart message

## 2023-06-30 ENCOUNTER — Other Ambulatory Visit: Payer: Self-pay | Admitting: Nurse Practitioner

## 2023-06-30 DIAGNOSIS — E1165 Type 2 diabetes mellitus with hyperglycemia: Secondary | ICD-10-CM

## 2023-07-10 ENCOUNTER — Other Ambulatory Visit: Payer: Self-pay | Admitting: Nurse Practitioner

## 2023-07-10 DIAGNOSIS — E119 Type 2 diabetes mellitus without complications: Secondary | ICD-10-CM

## 2023-11-11 ENCOUNTER — Other Ambulatory Visit: Payer: Self-pay | Admitting: Nurse Practitioner

## 2023-11-11 DIAGNOSIS — E119 Type 2 diabetes mellitus without complications: Secondary | ICD-10-CM

## 2023-11-11 DIAGNOSIS — E782 Mixed hyperlipidemia: Secondary | ICD-10-CM

## 2024-01-26 ENCOUNTER — Other Ambulatory Visit (HOSPITAL_COMMUNITY): Payer: Self-pay

## 2024-01-26 ENCOUNTER — Telehealth: Payer: Self-pay

## 2024-01-26 NOTE — Telephone Encounter (Signed)
 Called pt. 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  Will upload to mychart with current insurance information.

## 2024-01-26 NOTE — Telephone Encounter (Signed)
 Pharmacy Patient Advocate Encounter   Received notification from Onbase that prior authorization for [facility] 2 sensors is required/requested. Unable to verify insurance. Plan from eligibility checks and card in patient media says patient coverage has ended. Please advise
